# Patient Record
Sex: Male | Born: 1980 | Race: Black or African American | Hispanic: No | Marital: Single | Smoking: Current every day smoker
Health system: Southern US, Community
[De-identification: ages and names within clinical notes are randomized; demographics above are authoritative.]

## PROBLEM LIST (undated history)

## (undated) HISTORY — PX: FINGER SURGERY: SHX640

---

## 2009-09-11 ENCOUNTER — Emergency Department (HOSPITAL_COMMUNITY): Admission: EM | Admit: 2009-09-11 | Discharge: 2009-09-11 | Payer: Self-pay | Admitting: Emergency Medicine

## 2009-09-27 ENCOUNTER — Emergency Department (HOSPITAL_COMMUNITY): Admission: EM | Admit: 2009-09-27 | Discharge: 2009-09-27 | Payer: Self-pay | Admitting: Emergency Medicine

## 2009-10-02 ENCOUNTER — Emergency Department (HOSPITAL_COMMUNITY): Admission: EM | Admit: 2009-10-02 | Discharge: 2009-10-02 | Payer: Self-pay | Admitting: Emergency Medicine

## 2009-10-09 ENCOUNTER — Emergency Department (HOSPITAL_COMMUNITY): Admission: EM | Admit: 2009-10-09 | Discharge: 2009-10-09 | Payer: Self-pay | Admitting: Emergency Medicine

## 2010-04-21 LAB — POCT I-STAT, CHEM 8
BUN: 10 mg/dL (ref 6–23)
Calcium, Ion: 1.14 mmol/L (ref 1.12–1.32)
Chloride: 105 mEq/L (ref 96–112)
Glucose, Bld: 106 mg/dL — ABNORMAL HIGH (ref 70–99)

## 2013-01-24 ENCOUNTER — Encounter (HOSPITAL_COMMUNITY): Payer: Self-pay | Admitting: Emergency Medicine

## 2013-01-24 ENCOUNTER — Emergency Department (HOSPITAL_COMMUNITY)
Admission: EM | Admit: 2013-01-24 | Discharge: 2013-01-24 | Disposition: A | Discharge status: Home / Self Care | Payer: Self-pay | Attending: Emergency Medicine | Admitting: Emergency Medicine

## 2013-01-24 ENCOUNTER — Emergency Department (HOSPITAL_COMMUNITY): Payer: Self-pay

## 2013-01-24 DIAGNOSIS — IMO0002 Reserved for concepts with insufficient information to code with codable children: Secondary | ICD-10-CM

## 2013-01-24 DIAGNOSIS — Z87891 Personal history of nicotine dependence: Secondary | ICD-10-CM | POA: Insufficient documentation

## 2013-01-24 DIAGNOSIS — N509 Disorder of male genital organs, unspecified: Secondary | ICD-10-CM | POA: Insufficient documentation

## 2013-01-24 LAB — URINALYSIS, ROUTINE W REFLEX MICROSCOPIC
Bilirubin Urine: NEGATIVE
Glucose, UA: NEGATIVE mg/dL
Ketones, ur: NEGATIVE mg/dL
Leukocytes, UA: NEGATIVE
pH: 7.5 (ref 5.0–8.0)

## 2013-01-24 MED ORDER — IBUPROFEN 400 MG PO TABS
600.0000 mg | ORAL_TABLET | Freq: Once | ORAL | Status: DC
  Filled 2013-01-24 (×2): qty 1

## 2013-01-24 NOTE — ED Provider Notes (Signed)
CSN: [id number]     Arrival date & time 01/24/13  1032 History   First MD Initiated Contact with Patient 01/24/13 1056     Chief Complaint  Patient presents with  . Groin Pain   (Consider location/radiation/quality/duration/timing/severity/associated sxs/prior Treatment) Patient is a 32 y.o. male presenting with groin pain. The history is provided by the patient.  Groin Pain Pertinent negatives include no abdominal pain.  pt notes intermittent right testicle pain for past 2 years, but more pronounced/constant in past day. Pain w dull, mild-mod, non radiating. No specific exacerbating or alleviating factors. No trauma to area. No dysuria or hematuria. No flank pain. No hx kidney stones. No abd swelling or distension. No nv. Urinating or normal frequency. No skin lesions/ulcers. No penile discharge.      History reviewed. No pertinent past medical history. History reviewed. No pertinent past surgical history. History reviewed. No pertinent family history. History  Substance Use Topics  . Smoking status: Former Games developer  . Smokeless tobacco: Not on file  . Alcohol Use: No    Review of Systems  Constitutional: Negative for fever and chills.  Gastrointestinal: Negative for vomiting, abdominal pain, diarrhea and abdominal distention.  Genitourinary: Positive for testicular pain. Negative for dysuria, flank pain, discharge, scrotal swelling and genital sores.  Musculoskeletal: Negative for back pain.  Skin: Negative for rash and wound.  Hematological: Negative for adenopathy.    Allergies  Review of patient's allergies indicates no known allergies.  Home Medications  No current outpatient prescriptions on file. BP 155/94  Pulse 71  Temp(Src) 98.2 F (36.8 C) (Oral)  Resp 15  SpO2 98% Physical Exam  Nursing note and vitals reviewed. Constitutional: He is oriented to person, place, and time. He appears well-developed and well-nourished. No distress.  HENT:  Head: Atraumatic.   Eyes: Conjunctivae are normal.  Neck: Neck supple. No tracheal deviation present.  Cardiovascular: Normal rate.   Pulmonary/Chest: Effort normal. No accessory muscle usage. No respiratory distress.  Abdominal: Soft. Bowel sounds are normal. He exhibits no distension and no mass. There is no tenderness. There is no rebound and no guarding.  No hernia  Genitourinary:  Normal external genitalia. ?mild tenderness right testicle. No swelling. No skin changes or lesions. No l/a.  No hernia. No penile discharge.     Musculoskeletal: Normal range of motion.  Neurological: He is alert and oriented to person, place, and time.  Skin: Skin is warm and dry. He is not diaphoretic.  Psychiatric: He has a normal mood and affect.    ED Course  Procedures (including critical care time)  EKG Interpretation   None      Results for orders placed during the hospital encounter of 01/24/13  URINALYSIS, ROUTINE W REFLEX MICROSCOPIC      Result Value Range   Color, Urine YELLOW  YELLOW   APPearance CLEAR  CLEAR   Specific Gravity, Urine 1.016  1.005 - 1.030   pH 7.5  5.0 - 8.0   Glucose, UA NEGATIVE  NEGATIVE mg/dL   Hgb urine dipstick NEGATIVE  NEGATIVE   Bilirubin Urine NEGATIVE  NEGATIVE   Ketones, ur NEGATIVE  NEGATIVE mg/dL   Protein, ur NEGATIVE  NEGATIVE mg/dL   Urobilinogen, UA 1.0  0.0 - 1.0 mg/dL   Nitrite NEGATIVE  NEGATIVE   Leukocytes, UA NEGATIVE  NEGATIVE   US Scrotum  01/24/2013   CLINICAL DATA:  Right testicular pain.  Rule out torsion.  EXAM: ULTRASOUND OF SCROTUM  TECHNIQUE: Complete ultrasound examination of  the testicles, epididymis, and other scrotal structures was performed.  COMPARISON:  None.  FINDINGS: Right testicle  Measurements: 3.2 x 1.4 x 2.2 cm. No mass visualized. Scattered microcalcifications.  Left testicle  Measurements: 3.4 x 1.8 x 2.2 cm. No mass visualized. Scattered microcalcifications.  Right epididymis:  Normal in size and appearance.  Left epididymis:   Normal in size and appearance.  Hydrocele:  Bilateral, small.  Varicocele:  None visualized.  Doppler: Color and Doppler interrogation of the testes demonstrate normal and symmetric low resistance arterial and venous waveforms.  IMPRESSION: No acute findings.  No evidence of torsion.  Microlithiasis. Current literature suggests that testicular microlithiasis is not a significant independent risk factor for development of testicular carcinoma, and that follow up imaging is not warranted in the absence of other risk factors. Monthly testicular self-examination and annual physical exams are considered appropriate surveillance. If patient has other risk factors for testicular carcinoma, then referral to Urology should be considered. (Reference: DeCastro, et al.: A 5-Year Follow up Study of Asymptomatic Men with Testicular Microlithiasis. J Urol 2008; 179:1420-1423.)   Electronically Signed   By: Charlett Nose M.D.   On: 01/24/2013 11:45   [place] Art/ven Flow Abd Pelv Doppler  01/24/2013   CLINICAL DATA:  Right testicular pain.  Rule out torsion.  EXAM: ULTRASOUND OF SCROTUM  TECHNIQUE: Complete ultrasound examination of the testicles, epididymis, and other scrotal structures was performed.  COMPARISON:  None.  FINDINGS: Right testicle  Measurements: 3.2 x 1.4 x 2.2 cm. No mass visualized. Scattered microcalcifications.  Left testicle  Measurements: 3.4 x 1.8 x 2.2 cm. No mass visualized. Scattered microcalcifications.  Right epididymis:  Normal in size and appearance.  Left epididymis:  Normal in size and appearance.  Hydrocele:  Bilateral, small.  Varicocele:  None visualized.  Doppler: Color and Doppler interrogation of the testes demonstrate normal and symmetric low resistance arterial and venous waveforms.  IMPRESSION: No acute findings.  No evidence of torsion.  Microlithiasis. Current literature suggests that testicular microlithiasis is not a significant independent risk factor for development of testicular  carcinoma, and that follow up imaging is not warranted in the absence of other risk factors. Monthly testicular self-examination and annual physical exams are considered appropriate surveillance. If patient has other risk factors for testicular carcinoma, then referral to Urology should be considered. (Reference: DeCastro, et al.: A 5-Year Follow up Study of Asymptomatic Men with Testicular Microlithiasis. J Urol 2008; 179:1420-1423.)   Electronically Signed   By: Charlett Nose M.D.   On: 01/24/2013 11:45     MDM  Motrin po. Ua.   U/s.   Reviewed nursing notes and prior charts for additional history.   Recheck pt comfortable. Discussed u/s.  Pt appears stable for d/c.       Suzi Roots, MD 01/24/13 [phone]

## 2013-01-24 NOTE — ED Notes (Signed)
Pt. Stated, I've had on and off pain in my testicles for about 2 years.  Its even tender to touch.

## 2014-07-22 ENCOUNTER — Emergency Department (HOSPITAL_COMMUNITY)
Admission: EM | Admit: 2014-07-22 | Discharge: 2014-07-22 | Disposition: A | Discharge status: Home / Self Care | Payer: Self-pay | Attending: Emergency Medicine | Admitting: Emergency Medicine

## 2014-07-22 ENCOUNTER — Encounter (HOSPITAL_COMMUNITY): Payer: Self-pay | Admitting: *Deleted

## 2014-07-22 DIAGNOSIS — R1012 Left upper quadrant pain: Secondary | ICD-10-CM | POA: Insufficient documentation

## 2014-07-22 DIAGNOSIS — R197 Diarrhea, unspecified: Secondary | ICD-10-CM | POA: Insufficient documentation

## 2014-07-22 DIAGNOSIS — Z720 Tobacco use: Secondary | ICD-10-CM | POA: Insufficient documentation

## 2014-07-22 LAB — CBC WITH DIFFERENTIAL/PLATELET
Basophils Absolute: 0 10*3/uL (ref 0.0–0.1)
Basophils Relative: 0 % (ref 0–1)
EOS ABS: 0.1 10*3/uL (ref 0.0–0.7)
Eosinophils Relative: 1 % (ref 0–5)
HEMATOCRIT: 46.4 % (ref 39.0–52.0)
HEMOGLOBIN: 16.5 g/dL (ref 13.0–17.0)
Lymphocytes Relative: 32 % (ref 12–46)
Lymphs Abs: 1.6 10*3/uL (ref 0.7–4.0)
MCH: 30.1 pg (ref 26.0–34.0)
MCHC: 35.6 g/dL (ref 30.0–36.0)
MCV: 84.5 fL (ref 78.0–100.0)
Monocytes Absolute: 0.7 10*3/uL (ref 0.1–1.0)
Monocytes Relative: 13 % — ABNORMAL HIGH (ref 3–12)
NEUTROS ABS: 2.7 10*3/uL (ref 1.7–7.7)
Neutrophils Relative %: 54 % (ref 43–77)
Platelets: 237 10*3/uL (ref 150–400)
RBC: 5.49 MIL/uL (ref 4.22–5.81)
RDW: 12.9 % (ref 11.5–15.5)
WBC: 5.1 10*3/uL (ref 4.0–10.5)

## 2014-07-22 LAB — COMPREHENSIVE METABOLIC PANEL
ALBUMIN: 4.1 g/dL (ref 3.5–5.0)
ALT: 24 U/L (ref 17–63)
AST: 29 U/L (ref 15–41)
Alkaline Phosphatase: 80 U/L (ref 38–126)
Anion gap: 13 (ref 5–15)
BUN: 7 mg/dL (ref 6–20)
CALCIUM: 9.4 mg/dL (ref 8.9–10.3)
CO2: 23 mmol/L (ref 22–32)
CREATININE: 1.03 mg/dL (ref 0.61–1.24)
Chloride: 100 mmol/L — ABNORMAL LOW (ref 101–111)
GFR calc Af Amer: >60 mL/min (ref >60)
Glucose, Bld: 144 mg/dL — ABNORMAL HIGH (ref 65–99)
Potassium: 3.6 mmol/L (ref 3.5–5.1)
Sodium: 136 mmol/L (ref 135–145)
TOTAL PROTEIN: 7.6 g/dL (ref 6.5–8.1)
Total Bilirubin: 0.8 mg/dL (ref 0.3–1.2)

## 2014-07-22 LAB — LIPASE, BLOOD: Lipase: 31 U/L (ref 22–51)

## 2014-07-22 MED ORDER — LOPERAMIDE HCL 2 MG PO CAPS
2.0000 mg | ORAL_CAPSULE | Freq: Once | ORAL | Status: AC
  Administered 2014-07-22: 2 mg via ORAL
  Filled 2014-07-22: qty 1

## 2014-07-22 MED ORDER — LOPERAMIDE HCL 2 MG PO CAPS: 2.0000 mg | ORAL_CAPSULE | Freq: Four times a day (QID) | ORAL | Status: DC | PRN

## 2014-07-22 NOTE — ED Notes (Signed)
Pt states of diarrhea and abdominal pain after eating x 3 days.

## 2014-07-22 NOTE — ED Provider Notes (Signed)
CSN: [id number]     Arrival date & time 07/22/14  0719 History   First MD Initiated Contact with Patient 07/22/14 0747     Chief Complaint  Patient presents with  . Abdominal Pain     (Consider location/radiation/quality/duration/timing/severity/associated sxs/prior Treatment) Patient is a 34 y.o. male presenting with abdominal pain. The history is provided by the patient.  Abdominal Pain Associated symptoms: diarrhea   Associated symptoms: no chest pain, no fever, no nausea, no shortness of breath and no vomiting   patient presents with dull abdominal pain and diarrhea for 3 days. No sick contacts with diarrhea but his daughter did have a sore throat. No fevers. No nausea. The pain is dull and crampy. States the diarrhea and the pain increased after eating. He has not had recent antibodies. He has not had atelectasis in the past. Diarrhea is just watery. No blood. No nausea vomiting.  History reviewed. No pertinent past medical history. History reviewed. No pertinent past surgical history. No family history on file. History  Substance Use Topics  . Smoking status: Current Every Day Smoker -- 0.50 packs/day    Types: Cigarettes  . Smokeless tobacco: Not on file  . Alcohol Use: Yes     Comment: occ    Review of Systems  Constitutional: Negative for fever, activity change and appetite change.  Eyes: Negative for pain.  Respiratory: Negative for chest tightness and shortness of breath.   Cardiovascular: Negative for chest pain and leg swelling.  Gastrointestinal: Positive for abdominal pain and diarrhea. Negative for nausea and vomiting.  Genitourinary: Negative for flank pain.  Musculoskeletal: Negative for back pain and neck stiffness.  Skin: Negative for rash.  Neurological: Negative for weakness, numbness and headaches.  Psychiatric/Behavioral: Negative for behavioral problems.      Allergies  Review of patient's allergies indicates no known allergies.  Home Medications    Prior to Admission medications   Medication Sig Start Date End Date Taking? Authorizing Provider  loperamide (IMODIUM) 2 MG capsule Take 1 capsule (2 mg total) by mouth 4 (four) times daily as needed for diarrhea or loose stools. 07/22/14   Benjiman Core, MD   BP 154/98 mmHg  Pulse 61  Temp(Src) 98.2 F (36.8 C) (Oral)  Resp 18  Ht  (1.727 m)  Wt 239 lb (108.41 kg)  BMI 36.35 kg/m2  SpO2 97% Physical Exam  Constitutional: He is oriented to person, place, and time. He appears well-developed and well-nourished.  HENT:  Head: Normocephalic and atraumatic.  Neck: Normal range of motion. Neck supple.  Cardiovascular: Normal rate, regular rhythm and normal heart sounds.   No murmur heard. Pulmonary/Chest: Effort normal and breath sounds normal.  Abdominal: Soft. He exhibits no distension and no mass. There is tenderness. There is no rebound and no guarding.  Mild upper abdominal and left sided abdominal tenderness without rebound or guarding. No right lower quadrant tenderness.  Musculoskeletal: Normal range of motion. He exhibits no edema.  Neurological: He is alert and oriented to person, place, and time. No cranial nerve deficit.  Skin: Skin is warm and dry.  Psychiatric: He has a normal mood and affect.  Nursing note and vitals reviewed.   ED Course  Procedures (including critical care time) Labs Review Labs Reviewed  CBC WITH DIFFERENTIAL/PLATELET - Abnormal; Notable for the following:    Monocytes Relative 13 (*)    All other components within normal limits  COMPREHENSIVE METABOLIC PANEL - Abnormal; Notable for the following:    Chloride  100 (*)    Glucose, Bld 144 (*)    All other components within normal limits  LIPASE, BLOOD    Imaging Review No results found.   EKG Interpretation None      MDM   Final diagnoses:  Diarrhea    Patient with diarrhea and abdominal cramping. Benign exam. No vomiting. Slightly elevated sugar and it has some  hypertension. White count is not significantly elevated. Will discharge home. Patient was informed of the need to follow-up for his blood glucose and hypertension. Doubt severe infection or diverticulitis but was given follow-up instructions.    Benjiman Core, MD 07/22/14 [phone]

## 2014-07-22 NOTE — Discharge Instructions (Signed)

## 2014-07-27 ENCOUNTER — Encounter (HOSPITAL_COMMUNITY): Payer: Self-pay | Admitting: *Deleted

## 2014-07-27 ENCOUNTER — Emergency Department (HOSPITAL_COMMUNITY): Payer: Self-pay

## 2014-07-27 ENCOUNTER — Emergency Department (HOSPITAL_COMMUNITY)
Admission: EM | Admit: 2014-07-27 | Discharge: 2014-07-27 | Disposition: A | Discharge status: Home / Self Care | Payer: Self-pay | Attending: Emergency Medicine | Admitting: Emergency Medicine

## 2014-07-27 DIAGNOSIS — Z113 Encounter for screening for infections with a predominantly sexual mode of transmission: Secondary | ICD-10-CM | POA: Insufficient documentation

## 2014-07-27 DIAGNOSIS — Z720 Tobacco use: Secondary | ICD-10-CM | POA: Insufficient documentation

## 2014-07-27 DIAGNOSIS — Z711 Person with feared health complaint in whom no diagnosis is made: Secondary | ICD-10-CM

## 2014-07-27 DIAGNOSIS — R109 Unspecified abdominal pain: Secondary | ICD-10-CM

## 2014-07-27 DIAGNOSIS — N5089 Other specified disorders of the male genital organs: Secondary | ICD-10-CM

## 2014-07-27 DIAGNOSIS — N50812 Left testicular pain: Secondary | ICD-10-CM

## 2014-07-27 DIAGNOSIS — R197 Diarrhea, unspecified: Secondary | ICD-10-CM | POA: Insufficient documentation

## 2014-07-27 DIAGNOSIS — N508 Other specified disorders of male genital organs: Secondary | ICD-10-CM | POA: Insufficient documentation

## 2014-07-27 LAB — CBC WITH DIFFERENTIAL/PLATELET
Basophils Absolute: 0 10*3/uL (ref 0.0–0.1)
Basophils Relative: 0 % (ref 0–1)
Eosinophils Absolute: 0.1 10*3/uL (ref 0.0–0.7)
Eosinophils Relative: 2 % (ref 0–5)
HCT: 43.8 % (ref 39.0–52.0)
Hemoglobin: 15.6 g/dL (ref 13.0–17.0)
Lymphocytes Relative: 41 % (ref 12–46)
Lymphs Abs: 2.5 10*3/uL (ref 0.7–4.0)
MCH: 30.2 pg (ref 26.0–34.0)
MCHC: 35.6 g/dL (ref 30.0–36.0)
MCV: 84.7 fL (ref 78.0–100.0)
Monocytes Absolute: 0.5 10*3/uL (ref 0.1–1.0)
Monocytes Relative: 8 % (ref 3–12)
Neutro Abs: 3 10*3/uL (ref 1.7–7.7)
Neutrophils Relative %: 49 % (ref 43–77)
Platelets: 246 10*3/uL (ref 150–400)
RBC: 5.17 MIL/uL (ref 4.22–5.81)
RDW: 12.9 % (ref 11.5–15.5)
WBC: 6.2 10*3/uL (ref 4.0–10.5)

## 2014-07-27 LAB — COMPREHENSIVE METABOLIC PANEL
ALT: 19 U/L (ref 17–63)
AST: 21 U/L (ref 15–41)
Albumin: 4 g/dL (ref 3.5–5.0)
Alkaline Phosphatase: 73 U/L (ref 38–126)
Anion gap: 6 (ref 5–15)
BUN: 8 mg/dL (ref 6–20)
CO2: 23 mmol/L (ref 22–32)
Calcium: 9.1 mg/dL (ref 8.9–10.3)
Chloride: 107 mmol/L (ref 101–111)
Creatinine, Ser: 0.96 mg/dL (ref 0.61–1.24)
GFR calc Af Amer: >60 mL/min (ref >60)
GFR calc non Af Amer: >60 mL/min (ref >60)
Glucose, Bld: 118 mg/dL — ABNORMAL HIGH (ref 65–99)
Potassium: 3.9 mmol/L (ref 3.5–5.1)
Sodium: 136 mmol/L (ref 135–145)
Total Bilirubin: 0.6 mg/dL (ref 0.3–1.2)
Total Protein: 6.6 g/dL (ref 6.5–8.1)

## 2014-07-27 LAB — URINALYSIS, ROUTINE W REFLEX MICROSCOPIC
Bilirubin Urine: NEGATIVE
Glucose, UA: NEGATIVE mg/dL
Hgb urine dipstick: NEGATIVE
Ketones, ur: NEGATIVE mg/dL
Leukocytes, UA: NEGATIVE
Nitrite: NEGATIVE
Protein, ur: NEGATIVE mg/dL
Specific Gravity, Urine: 1.011 (ref 1.005–1.030)
Urobilinogen, UA: 0.2 mg/dL (ref 0.0–1.0)
pH: 7.5 (ref 5.0–8.0)

## 2014-07-27 LAB — LIPASE, BLOOD: Lipase: 35 U/L (ref 22–51)

## 2014-07-27 MED ORDER — CEFTRIAXONE SODIUM 250 MG IJ SOLR
250.0000 mg | Freq: Once | INTRAMUSCULAR | Status: AC
  Administered 2014-07-27: 250 mg via INTRAMUSCULAR
  Filled 2014-07-27: qty 250

## 2014-07-27 MED ORDER — CEFTRIAXONE SODIUM 250 MG IJ SOLR: 250.0000 mg | Freq: Once | INTRAMUSCULAR | Status: DC

## 2014-07-27 MED ORDER — LIDOCAINE HCL (PF) 1 % IJ SOLN
0.9000 mL | Freq: Once | INTRAMUSCULAR | Status: AC
  Administered 2014-07-27: 0.9 mL

## 2014-07-27 MED ORDER — DEXTROSE 5 % IV SOLN: 250.0000 mg | Freq: Once | INTRAVENOUS | Status: DC

## 2014-07-27 MED ORDER — AZITHROMYCIN 250 MG PO TABS
1000.0000 mg | ORAL_TABLET | Freq: Once | ORAL | Status: AC
  Administered 2014-07-27: 1000 mg via ORAL
  Filled 2014-07-27: qty 4

## 2014-07-27 MED ORDER — LIDOCAINE HCL (PF) 1 % IJ SOLN
INTRAMUSCULAR | Status: AC
  Filled 2014-07-27: qty 5

## 2014-07-27 NOTE — ED Notes (Signed)
Pt returned from US

## 2014-07-27 NOTE — ED Notes (Addendum)
Reports lower abd pain and testicle pain intermittent and diarrhea. No distress noted. Denies n/v.

## 2014-07-27 NOTE — ED Provider Notes (Signed)
CSN: [id number]     Arrival date & time 07/27/14  0736 History   First MD Initiated Contact with Patient 07/27/14 [phone]     Chief Complaint  Patient presents with  . Abdominal Pain  . Diarrhea     (Consider location/radiation/quality/duration/timing/severity/associated sxs/prior Treatment) HPI  Pt is a 33yo male presenting to ED with c/o generalized abdominal pain with associated intermittent loose stools for 1.5 weeks. Pt was seen for same on 07/22/14. States diarrhea has improved with imodium but stools are still loose. No blood or mucous in stool. Abdominal pain is aching and cramping, 10/10, no pain medications taken PTA.  Pt states pain radiates into his Left testicle intermittently. Pt concerned for possible STD as he has had unprotected intercourse w/o specific known exposure.   History reviewed. No pertinent past medical history. History reviewed. No pertinent past surgical history. History reviewed. No pertinent family history. History  Substance Use Topics  . Smoking status: Current Every Day Smoker -- 0.50 packs/day    Types: Cigarettes  . Smokeless tobacco: Not on file  . Alcohol Use: Yes     Comment: occ    Review of Systems  Constitutional: Negative for fever, chills, diaphoresis and fatigue.  Respiratory: Negative for cough and shortness of breath.   Gastrointestinal: Positive for abdominal pain (diffuse) and diarrhea. Negative for nausea and vomiting.  Genitourinary: Positive for testicular pain (Right side). Negative for dysuria, urgency, frequency, hematuria, flank pain, decreased urine volume, discharge, penile swelling, scrotal swelling and penile pain.  Musculoskeletal: Negative for myalgias and back pain.  All other systems reviewed and are negative.     Allergies  Review of patient's allergies indicates no known allergies.  Home Medications   Prior to Admission medications   Medication Sig Start Date End Date Taking? Authorizing Provider  loperamide  (IMODIUM) 2 MG capsule Take 1 capsule (2 mg total) by mouth 4 (four) times daily as needed for diarrhea or loose stools. 07/22/14  Yes Benjiman Core, MD   BP 143/88 mmHg  Pulse 79  Temp(Src) 97.7 F (36.5 C) (Oral)  Resp 12  SpO2 100% Physical Exam  Constitutional: He appears well-developed and well-nourished.  HENT:  Head: Normocephalic and atraumatic.  Eyes: Conjunctivae are normal. No scleral icterus.  Neck: Normal range of motion.  Cardiovascular: Normal rate, regular rhythm and normal heart sounds.   Pulmonary/Chest: Effort normal and breath sounds normal. No respiratory distress. He has no wheezes. He has no rales. He exhibits no tenderness.  Abdominal: Soft. Bowel sounds are normal. He exhibits no distension and no mass. There is no tenderness. There is no rebound and no guarding.  Soft, non-distended, non-tender. No CVAT  Genitourinary: Penis normal. Right testis shows no mass, no swelling and no tenderness. Left testis shows tenderness. Left testis shows no mass and no swelling. Circumcised. No phimosis, paraphimosis, hypospadias, penile erythema or penile tenderness. No discharge found.  Chaperoned exam.   Musculoskeletal: Normal range of motion.  Neurological: He is alert.  Skin: Skin is warm and dry.  Nursing note and vitals reviewed.   ED Course  Procedures (including critical care time) Labs Review Labs Reviewed  COMPREHENSIVE METABOLIC PANEL - Abnormal; Notable for the following:    Glucose, Bld 118 (*)    All other components within normal limits  STOOL CULTURE  CBC WITH DIFFERENTIAL/PLATELET  LIPASE, BLOOD  URINALYSIS, ROUTINE W REFLEX MICROSCOPIC (NOT AT [facility])  GC/CHLAMYDIA PROBE AMP ([place]) NOT AT [facility]    Imaging Review US Scrotum  07/27/2014   CLINICAL DATA:  34 year old male with 1 week of left testicular pain. No known trauma. Initial encounter.  EXAM: SCROTAL ULTRASOUND  DOPPLER ULTRASOUND OF THE TESTICLES  TECHNIQUE: Complete ultrasound  examination of the testicles, epididymis, and other scrotal structures was performed. Color and spectral Doppler ultrasound were also utilized to evaluate blood flow to the testicles.  COMPARISON:  01/24/2013  FINDINGS: Right testicle  Measurements: 3.6 x 1.6 x 2.5 cm. No testicular mass, but there are chronic dystrophic calcifications/ microlithiasis similar to the prior study.  Left testicle  Measurements: 3.5 x 1.4 x 2.7 cm. No testicular mass, but chronic dystrophic calcification/microlithiasis similar to the 2014 study  Right epididymis:  Normal in size and appearance.  Left epididymis:  Normal in size and appearance.  Hydrocele:  None visualized.  Varicocele:  None visualized.  Pulsed Doppler interrogation of both testes demonstrates normal low resistance arterial and venous waveforms bilaterally.  IMPRESSION: 1. No evidence of testicular torsion or acute inflammation. 2. Chronic microlithiasis. Current literature suggests that testicular microlithiasis is not a significant independent risk factor for development of testicular carcinoma, and that follow up imaging is not warranted in the absence of other risk factors. Monthly testicular self-examination and annual physical exams are considered appropriate surveillance. If patient has other risk factors for testicular carcinoma, then referral to Urology should be considered. (Reference: DeCastro, et al.: A 5-Year Follow up Study of Asymptomatic Men with Testicular Microlithiasis. J Urol 2008; 179:1420-1423.)   Electronically Signed   By: Odessa Fleming M.D.   On: 07/27/2014 09:06   [place] Art/ven Flow Abd Pelv Doppler  07/27/2014   CLINICAL DATA:  34 year old male with 1 week of left testicular pain. No known trauma. Initial encounter.  EXAM: SCROTAL ULTRASOUND  DOPPLER ULTRASOUND OF THE TESTICLES  TECHNIQUE: Complete ultrasound examination of the testicles, epididymis, and other scrotal structures was performed. Color and spectral Doppler ultrasound were also utilized  to evaluate blood flow to the testicles.  COMPARISON:  01/24/2013  FINDINGS: Right testicle  Measurements: 3.6 x 1.6 x 2.5 cm. No testicular mass, but there are chronic dystrophic calcifications/ microlithiasis similar to the prior study.  Left testicle  Measurements: 3.5 x 1.4 x 2.7 cm. No testicular mass, but chronic dystrophic calcification/microlithiasis similar to the 2014 study  Right epididymis:  Normal in size and appearance.  Left epididymis:  Normal in size and appearance.  Hydrocele:  None visualized.  Varicocele:  None visualized.  Pulsed Doppler interrogation of both testes demonstrates normal low resistance arterial and venous waveforms bilaterally.  IMPRESSION: 1. No evidence of testicular torsion or acute inflammation. 2. Chronic microlithiasis. Current literature suggests that testicular microlithiasis is not a significant independent risk factor for development of testicular carcinoma, and that follow up imaging is not warranted in the absence of other risk factors. Monthly testicular self-examination and annual physical exams are considered appropriate surveillance. If patient has other risk factors for testicular carcinoma, then referral to Urology should be considered. (Reference: DeCastro, et al.: A 5-Year Follow up Study of Asymptomatic Men with Testicular Microlithiasis. J Urol 2008; 179:1420-1423.)   Electronically Signed   By: Odessa Fleming M.D.   On: 07/27/2014 09:06     EKG Interpretation None      MDM   Final diagnoses:  Testicular pain, left  Abdominal cramping  Diarrhea  Testicular microlithiasis  Concern about STD in male without diagnosis   Pt is a 34yo male presenting to ED with c/o generalized abdominal pain, loose stools and  pain radiating into his Left testicle intermittently. Concern for STD.  Benign abdominal exam. Not concerned for surgical abdomen. Scrotal ultrasound performed due to Left testicular tenderness. No masses or swelling noted on exam. U/S: No evidence  of testicular torsion or acute inflammation. Chronic microlithiasis.  Not considered a significant independent risk factor for testicular carcinoma, however, due to pt having pain as well as pt's young age and desire to possibly have children in the future, will refer to urology for further evaluation of microlithiasis.   GC/chlamydia swab sent to lab. Pt treated in ED empirically due to pt's concerns and hx of unprotected intercourse.   Pt had been c/o diarrhea, however, reports 1 loose stool yesterday and no BMs today.  Pt seems to be improving from initial visit to ED for diarrhea on 6/16. No evidence of emergent process taking place at this time. Discussed pt with Dr. Micheline Maze who also reviewed pt's medical records and imaging. Agrees with plan to have pt discharged home. F/u with PCP and urology, Dr. Marlou Porch. Home care instructions for diarrhea and testicular self-exam provided. Return precautions provided. Pt verbalized understanding and agreement with tx plan.      Junius Finner, PA-C 07/27/14 1016  Toy Cookey, MD 07/28/14 1120

## 2014-07-27 NOTE — ED Notes (Signed)
Pt at ultrasound

## 2014-07-28 LAB — GC/CHLAMYDIA PROBE AMP (~~LOC~~) NOT AT ARMC
Chlamydia: NEGATIVE
Neisseria Gonorrhea: NEGATIVE

## 2014-12-26 ENCOUNTER — Encounter (HOSPITAL_COMMUNITY): Payer: Self-pay | Admitting: Emergency Medicine

## 2014-12-26 ENCOUNTER — Emergency Department (HOSPITAL_COMMUNITY)
Admission: EM | Admit: 2014-12-26 | Discharge: 2014-12-26 | Disposition: A | Discharge status: Home / Self Care | Payer: Self-pay | Attending: Emergency Medicine | Admitting: Emergency Medicine

## 2014-12-26 DIAGNOSIS — S61215A Laceration without foreign body of left ring finger without damage to nail, initial encounter: Secondary | ICD-10-CM | POA: Insufficient documentation

## 2014-12-26 DIAGNOSIS — Y998 Other external cause status: Secondary | ICD-10-CM | POA: Insufficient documentation

## 2014-12-26 DIAGNOSIS — IMO0002 Reserved for concepts with insufficient information to code with codable children: Secondary | ICD-10-CM

## 2014-12-26 DIAGNOSIS — W268XXA Contact with other sharp object(s), not elsewhere classified, initial encounter: Secondary | ICD-10-CM | POA: Insufficient documentation

## 2014-12-26 DIAGNOSIS — Y9389 Activity, other specified: Secondary | ICD-10-CM | POA: Insufficient documentation

## 2014-12-26 DIAGNOSIS — Y9289 Other specified places as the place of occurrence of the external cause: Secondary | ICD-10-CM | POA: Insufficient documentation

## 2014-12-26 DIAGNOSIS — F17210 Nicotine dependence, cigarettes, uncomplicated: Secondary | ICD-10-CM | POA: Insufficient documentation

## 2014-12-26 MED ORDER — BACITRACIN ZINC 500 UNIT/GM EX OINT
1.0000 "application " | TOPICAL_OINTMENT | Freq: Two times a day (BID) | CUTANEOUS | Status: DC
  Administered 2014-12-26: 1 via TOPICAL
  Filled 2014-12-26: qty 0.9

## 2014-12-26 NOTE — Discharge Instructions (Signed)

## 2014-12-26 NOTE — ED Notes (Addendum)
Laceration to L 3rd digit from razor blade 20 min ago.  Pt had washcloth wrapped around finger.  Once he removed it in triage room and looked at finger he wanted to get a band-aid and leave.  Encouraged pt to wait and be seen.  Unknown last DT. Bleeding controlled.

## 2014-12-26 NOTE — ED Provider Notes (Signed)
CSN: [id number]     Arrival date & time 12/26/14  1945 History  By signing my name below, I, Murriel Hopperlec Bankhead, attest that this documentation has been prepared under the direction and in the presence of Rob  Estral BeachBrowning, [place] JerseyPA-C.  Electronically Signed: Murriel HopperAlec Bankhead, ED Scribe. 12/26/2014. 8:02 PM.   Chief Complaint  Patient presents with  . Extremity Laceration      The history is provided by the patient. No language interpreter was used.   HPI Comments: Carl Russell is a 34 y.o. male who presents to the Emergency Department complaining of a small laceration to his left third digit that has been present since pt cut his finger on a new razor blade twenty minutes PTA. Pt states that his finger would not stop bleeding earlier, even after applying pressure with a washcloth to the area. Pt states he came into ED for that reason, but reported that once he got back in a room, the laceration stopped bleeding. Pt asked if he could leave because the area stopped bleeding. Pt refused tetanus shot.    History reviewed. No pertinent past medical history. History reviewed. No pertinent past surgical history. No family history on file. Social History  Substance Use Topics  . Smoking status: Current Every Day Smoker -- 0.50 packs/day    Types: Cigarettes  . Smokeless tobacco: None  . Alcohol Use: Yes     Comment: occ    Review of Systems  Skin: Positive for wound.  All other systems reviewed and are negative.     Allergies  Review of patient's allergies indicates no known allergies.  Home Medications   Prior to Admission medications   Medication Sig Start Date End Date Taking? Authorizing Provider  loperamide (IMODIUM) 2 MG capsule Take 1 capsule (2 mg total) by mouth 4 (four) times daily as needed for diarrhea or loose stools. 07/22/14   Benjiman CoreNathan Pickering, MD   BP 154/90 mmHg  Pulse 88  Temp(Src) 98.2 F (36.8 C) (Oral)  Resp 18  SpO2 97% Physical Exam  Constitutional: He is oriented to  person, place, and time. He appears well-developed and well-nourished.  HENT:  Head: Normocephalic and atraumatic.  Cardiovascular: Normal rate.   Pulmonary/Chest: Effort normal.  Abdominal: He exhibits no distension.  Neurological: He is alert and oriented to person, place, and time.  Skin: Skin is warm and dry.  Very shallow cut to distal left middle finger (no repair required)  Psychiatric: He has a normal mood and affect.  Nursing note and vitals reviewed.   ED Course  Procedures (including critical care time)  DIAGNOSTIC STUDIES: Oxygen Saturation is 97% on room air, normal by my interpretation.    COORDINATION OF CARE: 8:01 PM Discussed treatment plan with pt at bedside and pt agreed to plan.     MDM   Final diagnoses:  Laceration    Patient with small cut to finger.  No repair required.  Patient refuses tdap.  Requests to be discharge after uncovering finger and seeing simple nature of injury.  I personally performed the services described in this documentation, which was scribed in my presence. The recorded information has been reviewed and is accurate.     Roxy HorsemanRobert Trease [place], PA-C 12/26/14 2041  Richardean Canalavid H Yao, MD 12/26/14 [phone]

## 2015-11-06 ENCOUNTER — Encounter (HOSPITAL_COMMUNITY): Payer: Self-pay | Admitting: Emergency Medicine

## 2015-11-06 ENCOUNTER — Emergency Department (HOSPITAL_COMMUNITY)
Admission: EM | Admit: 2015-11-06 | Discharge: 2015-11-06 | Disposition: A | Discharge status: Home / Self Care | Payer: Self-pay | Attending: Emergency Medicine | Admitting: Emergency Medicine

## 2015-11-06 ENCOUNTER — Emergency Department (HOSPITAL_COMMUNITY): Payer: Self-pay

## 2015-11-06 DIAGNOSIS — R748 Abnormal levels of other serum enzymes: Secondary | ICD-10-CM | POA: Insufficient documentation

## 2015-11-06 DIAGNOSIS — F17210 Nicotine dependence, cigarettes, uncomplicated: Secondary | ICD-10-CM | POA: Insufficient documentation

## 2015-11-06 DIAGNOSIS — R1084 Generalized abdominal pain: Secondary | ICD-10-CM | POA: Insufficient documentation

## 2015-11-06 LAB — CBC
HEMATOCRIT: 46.7 % (ref 39.0–52.0)
HEMOGLOBIN: 15.6 g/dL (ref 13.0–17.0)
MCH: 29.7 pg (ref 26.0–34.0)
MCHC: 33.4 g/dL (ref 30.0–36.0)
MCV: 88.8 fL (ref 78.0–100.0)
Platelets: 233 10*3/uL (ref 150–400)
RBC: 5.26 MIL/uL (ref 4.22–5.81)
RDW: 12.8 % (ref 11.5–15.5)
WBC: 7.6 10*3/uL (ref 4.0–10.5)

## 2015-11-06 LAB — COMPREHENSIVE METABOLIC PANEL
ALBUMIN: 3.8 g/dL (ref 3.5–5.0)
ALT: 20 U/L (ref 17–63)
ANION GAP: 8 (ref 5–15)
AST: 23 U/L (ref 15–41)
Alkaline Phosphatase: 68 U/L (ref 38–126)
BILIRUBIN TOTAL: 0.8 mg/dL (ref 0.3–1.2)
BUN: 10 mg/dL (ref 6–20)
CO2: 23 mmol/L (ref 22–32)
Calcium: 9.2 mg/dL (ref 8.9–10.3)
Chloride: 106 mmol/L (ref 101–111)
Creatinine, Ser: 0.96 mg/dL (ref 0.61–1.24)
GFR calc Af Amer: >60 mL/min (ref >60)
Glucose, Bld: 100 mg/dL — ABNORMAL HIGH (ref 65–99)
POTASSIUM: 3.4 mmol/L — AB (ref 3.5–5.1)
Sodium: 137 mmol/L (ref 135–145)
TOTAL PROTEIN: 6.2 g/dL — AB (ref 6.5–8.1)

## 2015-11-06 LAB — URINALYSIS, ROUTINE W REFLEX MICROSCOPIC
Bilirubin Urine: NEGATIVE
Glucose, UA: NEGATIVE mg/dL
Ketones, ur: NEGATIVE mg/dL
LEUKOCYTES UA: NEGATIVE
NITRITE: NEGATIVE
PH: 6 (ref 5.0–8.0)
Protein, ur: NEGATIVE mg/dL

## 2015-11-06 LAB — URINE MICROSCOPIC-ADD ON
Bacteria, UA: NONE SEEN
RBC / HPF: NONE SEEN RBC/hpf (ref 0–5)
WBC UA: NONE SEEN WBC/hpf (ref 0–5)

## 2015-11-06 LAB — LIPASE, BLOOD: Lipase: 127 U/L — ABNORMAL HIGH (ref 11–51)

## 2015-11-06 MED ORDER — ONDANSETRON HCL 4 MG PO TABS: 4.0000 mg | ORAL_TABLET | Freq: Three times a day (TID) | ORAL | 0 refills | Status: DC | PRN

## 2015-11-06 MED ORDER — ONDANSETRON 4 MG PO TBDP
4.0000 mg | ORAL_TABLET | Freq: Once | ORAL | Status: AC
  Administered 2015-11-06: 4 mg via ORAL
  Filled 2015-11-06: qty 1

## 2015-11-06 MED ORDER — DICYCLOMINE HCL 10 MG PO CAPS
20.0000 mg | ORAL_CAPSULE | Freq: Once | ORAL | Status: AC
Start: 2015-11-06 — End: 2015-11-06
  Administered 2015-11-06: 20 mg via ORAL
  Filled 2015-11-06: qty 2

## 2015-11-06 MED ORDER — ACETAMINOPHEN 500 MG PO TABS
1000.0000 mg | ORAL_TABLET | Freq: Once | ORAL | Status: AC
  Administered 2015-11-06: 1000 mg via ORAL
  Filled 2015-11-06: qty 2

## 2015-11-06 MED ORDER — DICYCLOMINE HCL 20 MG PO TABS: 20.0000 mg | ORAL_TABLET | Freq: Four times a day (QID) | ORAL | 0 refills | Status: DC | PRN

## 2015-11-06 NOTE — Discharge Instructions (Signed)
Please avoid alcohol use and fatty foods. You should remain on a clear liquid diet for the next 24-72 hours to allow your pancrease to rest. This likely the cause of your pain and your episode of vomiting today.  Follow the diet prescribed for pancreatitis after your symptoms have resolved.  SEEK IMMEDIATE MEDICAL ATTENTION IF: The pain does not go away or becomes severe.  A temperature above 101 develops.  Repeated vomiting occurs (multiple episodes).  The pain becomes localized to portions of the abdomen. The right side could possibly be appendicitis. In an adult, the left lower portion of the abdomen could be colitis or diverticulitis.  Blood is being passed in stools or vomit (bright red or black tarry stools).  Return also if you develop chest pain, difficulty breathing, dizziness or fainting, or become confused, poorly responsive, or inconsolable (young children).

## 2015-11-06 NOTE — ED Provider Notes (Signed)
[facility] Provider Note   CSN: [id number] Arrival date & time: 11/06/15  [id number]     History   Chief Complaint Chief Complaint  Patient presents with  . Abdominal Pain    HPI Carl Russell is a 35 y.o. male who has no contributing past medical history who presents emergency Department with chief complaint of crampy abdominal pain. Patient has had 3 days of abdominal pain. He describes the pain as crampy in the lower quadrants predominantly. He has had some nausea and one episode of vomiting, nonbloody, nonbilious vomitus last night. His pain coincides with 3 days of no cathartic bowel movements. He states that he tried to make Bowel movement last night, but only produced a few small hard stool balls. He has a history of constipation once about a year ago which self resolved. He has not taken any medications or tried any other interventions. He denies any previous history of surgeries to the abdomen. He denies fevers, chills, myalgias or other symptoms of systemic infection. HPI  History reviewed. No pertinent past medical history.  There are no active problems to display for this patient.   History reviewed. No pertinent surgical history.     Home Medications    Prior to Admission medications   Medication Sig Start Date End Date Taking? Authorizing Provider  dicyclomine (BENTYL) 20 MG tablet Take 1 tablet (20 mg total) by mouth 4 (four) times daily as needed for spasms. 11/06/15   Arthor CaptainAbigail Arsema Tusing, PA-C  loperamide (IMODIUM) 2 MG capsule Take 1 capsule (2 mg total) by mouth 4 (four) times daily as needed for diarrhea or loose stools. 07/22/14   Benjiman CoreNathan Pickering, MD  ondansetron (ZOFRAN) 4 MG tablet Take 1 tablet (4 mg total) by mouth every 8 (eight) hours as needed for nausea or vomiting. 11/06/15   Arthor CaptainAbigail Kaesha Kirsch, PA-C    Family History No family history on file.  Social History Social History  Substance Use Topics  . Smoking status: Current Every Day Smoker   Packs/day: 0.50    Types: Cigarettes  . Smokeless tobacco: Not on file  . Alcohol use Yes     Comment: occ     Allergies   Review of patient's allergies indicates no known allergies.   Review of Systems Review of Systems Ten systems reviewed and are negative for acute change, except as noted in the HPI.    Physical Exam Updated Vital Signs BP 121/81   Pulse (!) 57   Temp 97.7 F (36.5 C) (Oral)   Resp 16   Ht 5\' 8"  (1.727 m)   Wt 104.3 kg   SpO2 100%   BMI 34.97 kg/m   Physical Exam Physical Exam  Nursing note and vitals reviewed. Constitutional: He appears well-developed and well-nourished. No distress.  HENT:  Head: Normocephalic and atraumatic.  Eyes: Conjunctivae normal are normal. No scleral icterus.  Neck: Normal range of motion. Neck supple.  Cardiovascular: Normal rate, regular rhythm and normal heart sounds.   Pulmonary/Chest: Effort normal and breath sounds normal. No respiratory distress.  Abdominal: Soft. Distended, no focal tenderness, normal bowel sounds.  Musculoskeletal: He exhibits no edema.  Neurological: He is alert.  Skin: Skin is warm and dry. He is not diaphoretic.  Psychiatric: His behavior is normal.     ED Treatments / Results  Labs (all labs ordered are listed, but only abnormal results are displayed) Labs Reviewed  LIPASE, BLOOD - Abnormal; Notable for the following:       Result Value  Lipase 127 (*)    All other components within normal limits  COMPREHENSIVE METABOLIC PANEL - Abnormal; Notable for the following:    Potassium 3.4 (*)    Glucose, Bld 100 (*)    Total Protein 6.2 (*)    All other components within normal limits  URINALYSIS, ROUTINE W REFLEX MICROSCOPIC (NOT AT [facility]) - Abnormal; Notable for the following:    Specific Gravity, Urine >1.030 (*)    Hgb urine dipstick TRACE (*)    All other components within normal limits  URINE MICROSCOPIC-ADD ON - Abnormal; Notable for the following:    Squamous Epithelial /  LPF 0-5 (*)    All other components within normal limits  CBC    EKG  EKG Interpretation None       Radiology Dg Chest 2 View  Result Date: 11/06/2015 CLINICAL DATA:  35 year old male with history of lung opacity incompletely imaged on abdominal radiograph. EXAM: CHEST  2 VIEW COMPARISON:  No prior chest x-ray.  Abdominal radiograph 11/06/2015. FINDINGS: Finding on the recent chest radiograph corresponds to the normal portion of the right hemidiaphragm. However, there is also eventration of the right hemidiaphragm (normal anatomical variant). Lung volumes are normal. No consolidative airspace disease. No pleural effusions. No pneumothorax. No pulmonary nodule or mass noted. Pulmonary vasculature and the cardiomediastinal silhouette are within normal limits. IMPRESSION: 1. No radiographic evidence of acute cardiopulmonary disease. The finding on the recent abdominal radiograph is benign, as discussed above. Electronically Signed   By: Trudie Reed M.D.   On: 11/06/2015 08:22   Dg Abdomen 1 View  Result Date: 11/06/2015 CLINICAL DATA:  Pt complains of epigastric pain and constipation x 3 days; he reports emesis once yesterday; no history of abdominal surgery EXAM: ABDOMEN - 1 VIEW COMPARISON:  None. FINDINGS: There is no bowel dilation to suggest bowel obstruction or diffuse adynamic ileus. There is no free air. No evidence of renal or ureteral stones. Soft tissues are unremarkable. There is opacity at the right lung base obscuring the right hemidiaphragm. This is incompletely imaged. Skeletal structures are unremarkable. IMPRESSION: 1. No abnormality in the abdomen pelvis. No evidence of bowel obstruction or diffuse adynamic ileus. No free air. 2. Opacity at the right lung base, incompletely imaged. Consider follow-up PA and lateral chest radiographs for further assessment. Electronically Signed   By: Amie Portland M.D.   On: 11/06/2015 07:37    Procedures Procedures (including critical  care time)  Medications Ordered in ED Medications  dicyclomine (BENTYL) capsule 20 mg (20 mg Oral Given 11/06/15 0625)  ondansetron (ZOFRAN-ODT) disintegrating tablet 4 mg (4 mg Oral Given 11/06/15 0625)  acetaminophen (TYLENOL) tablet 1,000 mg (1,000 mg Oral Given 11/06/15 0800)     Initial Impression / Assessment and Plan / ED Course  I have reviewed the triage vital signs and the nursing notes.  Pertinent labs & imaging results that were available during my care of the patient were reviewed by me and considered in my medical decision making (see chart for details).  Clinical Course  Value Comment By Time  Lipase: (!) 127 Patient states that he has improved but having some slight abdominal pain. Reevaluation of the abdomen shows no focal abdominal pain. Patient states that he does drink etoh daily and drinks about 3-4 beers. He had one 24oz beer last night.  Arthor Captain, PA-C 10/01 1610   Patient KUB film reviewed.  No evidence of significant constipation or SBO. Abnormal finding in the R lung field. I  have informed the patient and will proceed with 2 view cxr Arthor Captain, PA-C 10/01 0805  EKG 12-Lead ECG interpretation   Date: @EDTODAY @  Rate: 61  Rhythm: normal sinus rhythm  QRS Axis: normal  Intervals: normal  ST/T Wave abnormalities: normal  Conduction Disutrbances: none  Narrative Interpretation:   Old EKG Reviewed:  none Arthor Captain, PA-C 10/01 0809   Patient cxr negative.  D/c with liquid diet, bentyl, and zofran. Avoid etoh. Patient is nontoxic, nonseptic appearing, in no apparent distress.  Patient's pain and other symptoms adequately managed in emergency department.  Fluid bolus given.  Labs, imaging and vitals reviewed.  Patient does not meet the SIRS or Sepsis criteria.  On repeat exam patient does not have a surgical abdomin and there are no peritoneal signs.  No indication of appendicitis, bowel obstruction, bowel perforation, cholecystitis, diverticulitis, PID  or ectopic pregnancy.  Patient discharged home with symptomatic treatment and given strict instructions for follow-up with their primary care physician.  I have also discussed reasons to return immediately to the ER.  Patient expresses understanding and agrees with plan.  Arthor Captain, PA-C 10/01 1610      Final Clinical Impressions(s) / ED Diagnoses   Final diagnoses:  Generalized abdominal pain  Elevated lipase    New Prescriptions Discharge Medication List as of 11/06/2015  9:23 AM    START taking these medications   Details  dicyclomine (BENTYL) 20 MG tablet Take 1 tablet (20 mg total) by mouth 4 (four) times daily as needed for spasms., Starting Sun 11/06/2015, Print    ondansetron (ZOFRAN) 4 MG tablet Take 1 tablet (4 mg total) by mouth every 8 (eight) hours as needed for nausea or vomiting., Starting Sun 11/06/2015, Print         Arthor Captain, PA-C 11/06/15 1100    Rolland Porter, MD 11/10/15 [phone]

## 2015-11-06 NOTE — ED Triage Notes (Signed)
Pt reports abdominal pain for three days, last bowel movement also three days ago, vomited once yesterday, no fever.

## 2015-11-06 NOTE — ED Notes (Signed)
Pt in radiology 

## 2015-11-09 ENCOUNTER — Emergency Department (HOSPITAL_COMMUNITY)
Admission: EM | Admit: 2015-11-09 | Discharge: 2015-11-09 | Disposition: A | Discharge status: Home / Self Care | Payer: Self-pay | Attending: Emergency Medicine | Admitting: Emergency Medicine

## 2015-11-09 ENCOUNTER — Emergency Department (HOSPITAL_COMMUNITY): Payer: Self-pay

## 2015-11-09 ENCOUNTER — Encounter (HOSPITAL_COMMUNITY): Payer: Self-pay

## 2015-11-09 DIAGNOSIS — K5900 Constipation, unspecified: Secondary | ICD-10-CM | POA: Insufficient documentation

## 2015-11-09 DIAGNOSIS — R1084 Generalized abdominal pain: Secondary | ICD-10-CM

## 2015-11-09 DIAGNOSIS — I10 Essential (primary) hypertension: Secondary | ICD-10-CM | POA: Insufficient documentation

## 2015-11-09 DIAGNOSIS — F17210 Nicotine dependence, cigarettes, uncomplicated: Secondary | ICD-10-CM | POA: Insufficient documentation

## 2015-11-09 LAB — URINALYSIS, ROUTINE W REFLEX MICROSCOPIC
BILIRUBIN URINE: NEGATIVE
Glucose, UA: NEGATIVE mg/dL
Hgb urine dipstick: NEGATIVE
KETONES UR: NEGATIVE mg/dL
Leukocytes, UA: NEGATIVE
NITRITE: NEGATIVE
Protein, ur: NEGATIVE mg/dL
Specific Gravity, Urine: 1.02 (ref 1.005–1.030)
pH: 6.5 (ref 5.0–8.0)

## 2015-11-09 LAB — CBC WITH DIFFERENTIAL/PLATELET
BASOS PCT: 0 %
Basophils Absolute: 0 10*3/uL (ref 0.0–0.1)
EOS ABS: 0.2 10*3/uL (ref 0.0–0.7)
Eosinophils Relative: 3 %
HCT: 47.3 % (ref 39.0–52.0)
Hemoglobin: 16.1 g/dL (ref 13.0–17.0)
Lymphocytes Relative: 34 %
Lymphs Abs: 2.1 10*3/uL (ref 0.7–4.0)
MCH: 30 pg (ref 26.0–34.0)
MCHC: 34 g/dL (ref 30.0–36.0)
MCV: 88.2 fL (ref 78.0–100.0)
MONO ABS: 0.6 10*3/uL (ref 0.1–1.0)
MONOS PCT: 10 %
NEUTROS PCT: 53 %
Neutro Abs: 3.3 10*3/uL (ref 1.7–7.7)
Platelets: 235 10*3/uL (ref 150–400)
RBC: 5.36 MIL/uL (ref 4.22–5.81)
RDW: 12.9 % (ref 11.5–15.5)
WBC: 6.1 10*3/uL (ref 4.0–10.5)

## 2015-11-09 LAB — COMPREHENSIVE METABOLIC PANEL
ALBUMIN: 3.9 g/dL (ref 3.5–5.0)
ALK PHOS: 62 U/L (ref 38–126)
ALT: 19 U/L (ref 17–63)
ANION GAP: 6 (ref 5–15)
AST: 23 U/L (ref 15–41)
BILIRUBIN TOTAL: 0.4 mg/dL (ref 0.3–1.2)
BUN: 9 mg/dL (ref 6–20)
CALCIUM: 9.3 mg/dL (ref 8.9–10.3)
CO2: 24 mmol/L (ref 22–32)
Chloride: 106 mmol/L (ref 101–111)
Creatinine, Ser: 0.95 mg/dL (ref 0.61–1.24)
GFR calc Af Amer: >60 mL/min (ref >60)
GLUCOSE: 115 mg/dL — AB (ref 65–99)
Potassium: 3.7 mmol/L (ref 3.5–5.1)
Sodium: 136 mmol/L (ref 135–145)
TOTAL PROTEIN: 6.7 g/dL (ref 6.5–8.1)

## 2015-11-09 LAB — POC OCCULT BLOOD, ED: FECAL OCCULT BLD: NEGATIVE

## 2015-11-09 LAB — LIPASE, BLOOD: Lipase: 34 U/L (ref 11–51)

## 2015-11-09 MED ORDER — FAMOTIDINE IN NACL 20-0.9 MG/50ML-% IV SOLN
20.0000 mg | Freq: Once | INTRAVENOUS | Status: AC
  Administered 2015-11-09: 20 mg via INTRAVENOUS
  Filled 2015-11-09: qty 50

## 2015-11-09 MED ORDER — OMEPRAZOLE 20 MG PO CPDR: 20.0000 mg | DELAYED_RELEASE_CAPSULE | Freq: Every day | ORAL | 0 refills | Status: DC

## 2015-11-09 MED ORDER — SUCRALFATE 1 G PO TABS
1.0000 g | ORAL_TABLET | Freq: Once | ORAL | Status: AC
  Administered 2015-11-09: 1 g via ORAL
  Filled 2015-11-09: qty 1

## 2015-11-09 MED ORDER — GI COCKTAIL ~~LOC~~
30.0000 mL | Freq: Once | ORAL | Status: AC
  Administered 2015-11-09: 30 mL via ORAL
  Filled 2015-11-09: qty 30

## 2015-11-09 MED ORDER — PEG 3350-KCL-NABCB-NACL-NASULF 236 G PO SOLR: 4000.0000 mL | Freq: Once | ORAL | 0 refills | Status: AC

## 2015-11-09 MED ORDER — IOPAMIDOL (ISOVUE-300) INJECTION 61%
INTRAVENOUS | Status: AC
  Administered 2015-11-09: 75 mL via INTRAVENOUS
  Filled 2015-11-09: qty 75

## 2015-11-09 MED ORDER — DICYCLOMINE HCL 10 MG PO CAPS
10.0000 mg | ORAL_CAPSULE | Freq: Once | ORAL | Status: AC
  Administered 2015-11-09: 10 mg via ORAL
  Filled 2015-11-09: qty 1

## 2015-11-09 NOTE — ED Triage Notes (Signed)
Pt c\o upper ABD pain for 4-5 days. Pt denies n/v/d. Last bowel movement was yesterday.

## 2015-11-09 NOTE — Care Management Note (Signed)
Case Management Note  Patient Details  Name: Carl Russell MRN: [id number] Date of Birth: [birth year]  Subjective/Objective:                  35 year old male with no significant past medical history or previous abdominal surgeries, presenting with generalized abdominal pain and constipation that has been worsening over the past 5-6 days.   Action/Plan: Follow for disposition needs. /Set up appointment for PCP establishment.   Expected Discharge Date:  11/09/15               Expected Discharge Plan:  Home/Self Care  In-House Referral:  NA  Discharge planning Services  CM Consult, Follow-up appt scheduled  Post Acute Care Choice:  NA Choice offered to:  Patient  DME Arranged:  N/A DME Agency:  NA  HH Arranged:  NA HH Agency:  NA  Status of Service:  In process, will continue to follow  If discussed at Long Length of Stay Meetings, dates discussed:    Additional Comments: Carl Russell J. Lucretia RoersWood, RN, BSN, [place] [phone] [facility].  Spoke with pt at bedside and provided brochure with directions and phone number highlighted.  Pt verbalizes understanding of keeping appointment.  Carl CohnWood, [name], RN 11/09/2015, 9:16 AM

## 2015-11-09 NOTE — ED Notes (Signed)
EKG given to Dr. Kohut 

## 2015-11-09 NOTE — ED Provider Notes (Signed)
[facility] Provider Note   CSN: [id number] Arrival date & time: 11/09/15  0709     History   Chief Complaint Chief Complaint  Patient presents with  . Abdominal Pain    HPI Carl Russell is a 35 y.o. male.  Patient is a 35 year old male with no significant past medical history or previous abdominal surgeries, presenting with generalized abdominal pain and constipation that has been worsening over the past 5-6 days. Describes the pain as bloated feeling with intermittent cramping in the lower quadrants. Also reports epigastric burning that started last evening, which he states "feels like heartburn." Denies any nausea, vomiting, diarrhea, blood in stools, dysuria, or hematuria. Has been eating and drinking with no change in appetite (last PO intake 8:00 last evening), with bowel movement at 2:00 this morning. Describes the stool as "small hard pieces," but well formed and without blood. States he has had dark stools, but taking Pepto for abdominal pain. Also tried OTC mineral oil without relief of constipation, and ibuprofen for pain. Was evaluated in ED on 10/1 for similar complaint, and sent home with prescription Bentyl. He did not fill the prescription. He denies any fever, chills, chest pain, palpitations, shortness of breath, or back pain.    History reviewed. No pertinent past medical history.  There are no active problems to display for this patient.   History reviewed. No pertinent surgical history.     Home Medications    Prior to Admission medications   Medication Sig Start Date End Date Taking? Authorizing Provider  dicyclomine (BENTYL) 20 MG tablet Take 1 tablet (20 mg total) by mouth 4 (four) times daily as needed for spasms. 11/06/15   Arthor Captain, PA-C  loperamide (IMODIUM) 2 MG capsule Take 1 capsule (2 mg total) by mouth 4 (four) times daily as needed for diarrhea or loose stools. 07/22/14   Benjiman Core, MD  ondansetron (ZOFRAN) 4 MG tablet Take 1  tablet (4 mg total) by mouth every 8 (eight) hours as needed for nausea or vomiting. 11/06/15   Arthor Captain, PA-C    Family History No family history on file.  Social History Social History  Substance Use Topics  . Smoking status: Current Every Day Smoker    Packs/day: 0.50    Types: Cigarettes  . Smokeless tobacco: Not on file  . Alcohol use Yes     Comment: occ     Allergies   Review of patient's allergies indicates no known allergies.   Review of Systems Review of Systems  Constitutional: Negative for chills and fever.  HENT: Negative for ear pain and sore throat.   Eyes: Negative for pain and visual disturbance.  Respiratory: Negative for cough and shortness of breath.   Cardiovascular: Negative for chest pain and palpitations.  Gastrointestinal: Positive for abdominal pain and constipation. Negative for blood in stool, nausea, rectal pain and vomiting.  Genitourinary: Negative for dysuria and hematuria.  Musculoskeletal: Negative for back pain and neck pain.  Skin: Negative for color change and rash.  Neurological: Negative for dizziness, seizures, syncope and headaches.  All other systems reviewed and are negative.    Physical Exam Updated Vital Signs BP (!) 186/111 (BP Location: Left Arm)   Pulse (!) 57   Temp 98 F (36.7 C) (Oral)   Resp 18   Ht 5\' 8"  (1.727 m)   Wt 104.3 kg   SpO2 99%   BMI 34.97 kg/m   Physical Exam  Constitutional: He appears well-developed and well-nourished. No distress.  HENT:  Head: Normocephalic and atraumatic.  Mouth/Throat: Oropharynx is clear and moist.  Eyes: Conjunctivae are normal.  Neck: Normal range of motion. Neck supple.  Cardiovascular: Normal rate, regular rhythm, normal heart sounds and intact distal pulses.   Pulmonary/Chest: Effort normal and breath sounds normal. No respiratory distress.  Abdominal: Soft.  Neg Murphy's, neg McBurney's, no CVA tenderness. Hyperactive bowel sounds in lower abdominal  quadrants. Mild TTP LLQ. No guarding or peritoneal signs   Musculoskeletal: Normal range of motion. He exhibits no edema or tenderness.  Neurological: He is alert.  Skin: Skin is warm and dry.  Psychiatric: He has a normal mood and affect.  Nursing note and vitals reviewed.    ED Treatments / Results  Labs (all labs ordered are listed, but only abnormal results are displayed) Labs Reviewed - No data to display  EKG  EKG Interpretation  Date/Time:  Wednesday November 09 2015 07:15:14 EDT Ventricular Rate:  63 PR Interval:    QRS Duration: 80 QT Interval:  451 QTC Calculation: 462 R Axis:   47 Text Interpretation:  Sinus rhythm No significant change since last tracing Confirmed by KOHUT  MD, STEPHEN ([id number]) on 11/09/2015 7:22:38 AM       Radiology No results found.  Procedures Procedures (including critical care time)  Medications Ordered in ED Medications - No data to display   Initial Impression / Assessment and Plan / ED Course  I have reviewed the triage vital signs and the nursing notes.  Pertinent labs & imaging results that were available during my care of the patient were reviewed by me and considered in my medical decision making (see chart for details).  Clinical Course   Patient is 35 yo M with no significant PMH or abdominal surgeries, presenting with generalized abdominal pain and constipation that has been worsening over the past 5-6 days. Was evaluated in ED on 10/1 for similar complaint, and sent home with prescription Bentyl, which he did not fill. States symptoms have been constant since d/c, but denies any nausea or vomiting. CBC, CMP, lipase, hemoccult, and UA ordered, all of which are unremarkable. CT abdomen ordered given return to ED with no improvement, which shows no acute abdominal pathology. EKG ordered given epigastric burning, but unremarkable, and likely symptoms of reflux. Bentyl, Pepcid, GI cocktail, and Carafate given for relief of pain.  Also noted to have elevated BP as noted below, but no primary care physician. Case Management consulted to help establish care with PCP. On reassessment, patient states he is feeling better. Abdomen is soft and non-tender, and BP improving. Stable for d/c home with Golytely for constipation and Prilosec for reflux. Also advised patient to fill prior prescription of Bentyl for relief of abdominal pain. [facility] [place] [facility] at 9:30 AM. Patient appreciative of care, and advised to return to ED for new or worsening symptoms.  Vitals:   11/09/15 0815 11/09/15 0830 11/09/15 0930 11/09/15 0945  BP: 152/93 (!) 151/106 142/87 137/94  Pulse: (!) 59 81 (!) 57 62  Resp: 14 22  18   Temp:      TempSrc:      SpO2: 100% 98% 99% 98%  Weight:      Height:         Final Clinical Impressions(s) / ED Diagnoses   Final diagnoses:  Constipation, unspecified constipation type  Generalized abdominal pain  Uncontrolled hypertension    New Prescriptions Discharge Medication  List as of 11/09/2015  9:48 AM    START taking these medications   Details  omeprazole (PRILOSEC) 20 MG capsule Take 1 capsule (20 mg total) by mouth daily., Starting Wed 11/09/2015, Print    polyethylene glycol (GOLYTELY) 236 g solution Take 4,000 mLs by mouth once., Starting Wed 11/09/2015, Print         Ivonne Andrew Trinity II, [place] 11/09/15 1127    Raeford Razor, MD 11/09/15 1257

## 2015-11-11 ENCOUNTER — Inpatient Hospital Stay: Payer: Self-pay

## 2015-11-16 ENCOUNTER — Emergency Department (HOSPITAL_COMMUNITY)
Admission: EM | Admit: 2015-11-16 | Discharge: 2015-11-16 | Disposition: A | Discharge status: Home / Self Care | Payer: Self-pay | Attending: Emergency Medicine | Admitting: Emergency Medicine

## 2015-11-16 DIAGNOSIS — L03012 Cellulitis of left finger: Secondary | ICD-10-CM | POA: Insufficient documentation

## 2015-11-16 DIAGNOSIS — F17210 Nicotine dependence, cigarettes, uncomplicated: Secondary | ICD-10-CM | POA: Insufficient documentation

## 2015-11-16 MED ORDER — CLINDAMYCIN HCL 300 MG PO CAPS: 300.0000 mg | ORAL_CAPSULE | Freq: Four times a day (QID) | ORAL | 0 refills | Status: DC

## 2015-11-16 NOTE — ED Provider Notes (Signed)
[facility] Provider Note   CSN: [id number] Arrival date & time: 11/16/15  0711     History   Chief Complaint Chief Complaint  Patient presents with  . Abscess    HPI Carl Russell is a 35 y.o. male.  Pt comes in with c/o pain and swelling to his left pinky finger. He states that he thinks that he was bit by something. Denies numbness or weakness. States that this started 2 days ago. No history of similar symptoms.      No past medical history on file.  There are no active problems to display for this patient.   No past surgical history on file.     Home Medications    Prior to Admission medications   Medication Sig Start Date End Date Taking? Authorizing Provider  acetaminophen (TYLENOL) 500 MG tablet Take 1,000 mg by mouth every 6 (six) hours as needed for moderate pain or headache.    Historical Provider, MD  dicyclomine (BENTYL) 20 MG tablet Take 1 tablet (20 mg total) by mouth 4 (four) times daily as needed for spasms. 11/06/15   Arthor CaptainAbigail Harris, PA-C  ibuprofen (ADVIL,MOTRIN) 200 MG tablet Take 800 mg by mouth every 6 (six) hours as needed for headache or moderate pain.    Historical Provider, MD  loperamide (IMODIUM) 2 MG capsule Take 1 capsule (2 mg total) by mouth 4 (four) times daily as needed for diarrhea or loose stools. Patient not taking: Reported on 11/09/2015 07/22/14   Benjiman CoreNathan Heron Pitcock, MD  omeprazole (PRILOSEC) 20 MG capsule Take 1 capsule (20 mg total) by mouth daily. 11/09/15   Daryl F de Villier II, PA  ondansetron (ZOFRAN) 4 MG tablet Take 1 tablet (4 mg total) by mouth every 8 (eight) hours as needed for nausea or vomiting. 11/06/15   Arthor CaptainAbigail Harris, PA-C    Family History No family history on file.  Social History Social History  Substance Use Topics  . Smoking status: Current Every Day Smoker    Packs/day: 0.50    Types: Cigarettes  . Smokeless tobacco: Not on file  . Alcohol use Yes     Comment: occ     Allergies   Review of  patient's allergies indicates no known allergies.   Review of Systems Review of Systems  All other systems reviewed and are negative.    Physical Exam Updated Vital Signs BP (!) 156/115 (BP Location: Right Arm)   Pulse 90   Temp 98.8 F (37.1 C) (Oral)   Resp 20   Ht 5\' 9"  (1.753 m)   Wt 105 kg   SpO2 98%   BMI 34.20 kg/m   Physical Exam  Constitutional: He appears well-developed and well-nourished.  Cardiovascular: Normal rate.   Pulmonary/Chest: Effort normal and breath sounds normal.  Musculoskeletal: Normal range of motion.  Neurological: He is alert.  Localized area of redness over the middle phalanx of the left pinky finger. No fluctuance noted. Area warm to the tough. No definite abscess noted  Skin: Capillary refill takes less than 2 seconds.     ED Treatments / Results  Labs (all labs ordered are listed, but only abnormal results are displayed) Labs Reviewed - No data to display  EKG  EKG Interpretation None       Radiology No results found.  Procedures Procedures (including critical care time)  Medications Ordered in ED Medications - No data to display   Initial Impression / Assessment and Plan / ED Course  I have reviewed  the triage vital signs and the nursing notes.  Pertinent labs & imaging results that were available during my care of the patient were reviewed by me and considered in my medical decision making (see chart for details).  Clinical Course    Exam consistent with cellulitis. No definite abscess noted. Will treat with antibiotics  Final Clinical Impressions(s) / ED Diagnoses   Final diagnoses:  None    New Prescriptions New Prescriptions   No medications on file     Teressa Lower, NP 11/16/15 1610    Shaune Pollack, MD 11/16/15 1756

## 2015-11-16 NOTE — ED Triage Notes (Signed)
Pt reports possible spider bite to left little finger, redness noted.

## 2016-01-21 ENCOUNTER — Encounter (HOSPITAL_COMMUNITY): Payer: Self-pay | Admitting: *Deleted

## 2016-01-21 ENCOUNTER — Emergency Department (HOSPITAL_COMMUNITY)
Admission: EM | Admit: 2016-01-21 | Discharge: 2016-01-21 | Disposition: A | Discharge status: Home / Self Care | Payer: Self-pay | Attending: Emergency Medicine | Admitting: Emergency Medicine

## 2016-01-21 DIAGNOSIS — Z5321 Procedure and treatment not carried out due to patient leaving prior to being seen by health care provider: Secondary | ICD-10-CM | POA: Insufficient documentation

## 2016-01-21 DIAGNOSIS — I10 Essential (primary) hypertension: Secondary | ICD-10-CM | POA: Insufficient documentation

## 2016-01-21 LAB — CBC
HCT: 46.7 % (ref 39.0–52.0)
Hemoglobin: 16.2 g/dL (ref 13.0–17.0)
MCH: 29.9 pg (ref 26.0–34.0)
MCHC: 34.7 g/dL (ref 30.0–36.0)
MCV: 86.3 fL (ref 78.0–100.0)
PLATELETS: 265 10*3/uL (ref 150–400)
RBC: 5.41 MIL/uL (ref 4.22–5.81)
RDW: 12.8 % (ref 11.5–15.5)
WBC: 5.8 10*3/uL (ref 4.0–10.5)

## 2016-01-21 LAB — COMPREHENSIVE METABOLIC PANEL
ALT: 29 U/L (ref 17–63)
AST: 28 U/L (ref 15–41)
Albumin: 4.1 g/dL (ref 3.5–5.0)
Alkaline Phosphatase: 79 U/L (ref 38–126)
Anion gap: 4 — ABNORMAL LOW (ref 5–15)
BUN: 7 mg/dL (ref 6–20)
CHLORIDE: 107 mmol/L (ref 101–111)
CO2: 25 mmol/L (ref 22–32)
CREATININE: 0.98 mg/dL (ref 0.61–1.24)
Calcium: 9.3 mg/dL (ref 8.9–10.3)
GFR calc non Af Amer: >60 mL/min (ref >60)
Glucose, Bld: 122 mg/dL — ABNORMAL HIGH (ref 65–99)
POTASSIUM: 3.7 mmol/L (ref 3.5–5.1)
SODIUM: 136 mmol/L (ref 135–145)
Total Bilirubin: 0.5 mg/dL (ref 0.3–1.2)
Total Protein: 7 g/dL (ref 6.5–8.1)

## 2016-01-21 NOTE — ED Notes (Signed)
Called no answer

## 2016-01-21 NOTE — ED Notes (Signed)
Called and no answer in waiting room 

## 2016-01-21 NOTE — ED Triage Notes (Signed)
The pt had a physical exam in [place] ansd was found to have high bp  The pt came here for treatemnt.  Hx of high bp no meds ever  Headache for 2 hours since [facility]

## 2016-01-21 NOTE — ED Notes (Signed)
Called pt in  Waiting room and unable to find.

## 2016-04-29 ENCOUNTER — Encounter (HOSPITAL_COMMUNITY): Payer: Self-pay | Admitting: Emergency Medicine

## 2016-04-29 ENCOUNTER — Emergency Department (HOSPITAL_COMMUNITY)
Admission: EM | Admit: 2016-04-29 | Discharge: 2016-04-29 | Disposition: A | Discharge status: Home / Self Care | Payer: Self-pay | Attending: Emergency Medicine | Admitting: Emergency Medicine

## 2016-04-29 DIAGNOSIS — Z79899 Other long term (current) drug therapy: Secondary | ICD-10-CM | POA: Insufficient documentation

## 2016-04-29 DIAGNOSIS — K219 Gastro-esophageal reflux disease without esophagitis: Secondary | ICD-10-CM | POA: Insufficient documentation

## 2016-04-29 DIAGNOSIS — R1013 Epigastric pain: Secondary | ICD-10-CM

## 2016-04-29 DIAGNOSIS — F17210 Nicotine dependence, cigarettes, uncomplicated: Secondary | ICD-10-CM | POA: Insufficient documentation

## 2016-04-29 LAB — CBC
HEMATOCRIT: 48.8 % (ref 39.0–52.0)
HEMOGLOBIN: 16.7 g/dL (ref 13.0–17.0)
MCH: 29.5 pg (ref 26.0–34.0)
MCHC: 34.2 g/dL (ref 30.0–36.0)
MCV: 86.1 fL (ref 78.0–100.0)
Platelets: 268 10*3/uL (ref 150–400)
RBC: 5.67 MIL/uL (ref 4.22–5.81)
RDW: 13.1 % (ref 11.5–15.5)
WBC: 7.6 10*3/uL (ref 4.0–10.5)

## 2016-04-29 LAB — COMPREHENSIVE METABOLIC PANEL
ALT: 22 U/L (ref 17–63)
ANION GAP: 12 (ref 5–15)
AST: 22 U/L (ref 15–41)
Albumin: 4.2 g/dL (ref 3.5–5.0)
Alkaline Phosphatase: 81 U/L (ref 38–126)
BILIRUBIN TOTAL: 0.3 mg/dL (ref 0.3–1.2)
BUN: 8 mg/dL (ref 6–20)
CO2: 21 mmol/L — ABNORMAL LOW (ref 22–32)
Calcium: 9.5 mg/dL (ref 8.9–10.3)
Chloride: 103 mmol/L (ref 101–111)
Creatinine, Ser: 0.94 mg/dL (ref 0.61–1.24)
GFR calc Af Amer: >60 mL/min (ref >60)
Glucose, Bld: 110 mg/dL — ABNORMAL HIGH (ref 65–99)
Potassium: 3.6 mmol/L (ref 3.5–5.1)
SODIUM: 136 mmol/L (ref 135–145)
TOTAL PROTEIN: 7 g/dL (ref 6.5–8.1)

## 2016-04-29 LAB — URINALYSIS, ROUTINE W REFLEX MICROSCOPIC
BILIRUBIN URINE: NEGATIVE
Glucose, UA: NEGATIVE mg/dL
HGB URINE DIPSTICK: NEGATIVE
Ketones, ur: NEGATIVE mg/dL
LEUKOCYTES UA: NEGATIVE
NITRITE: NEGATIVE
PROTEIN: NEGATIVE mg/dL
SPECIFIC GRAVITY, URINE: 1.008 (ref 1.005–1.030)
pH: 6 (ref 5.0–8.0)

## 2016-04-29 LAB — LIPASE, BLOOD: LIPASE: 31 U/L (ref 11–51)

## 2016-04-29 MED ORDER — GI COCKTAIL ~~LOC~~
30.0000 mL | Freq: Once | ORAL | Status: AC
  Administered 2016-04-29: 30 mL via ORAL
  Filled 2016-04-29: qty 30

## 2016-04-29 MED ORDER — OMEPRAZOLE 20 MG PO CPDR: 20.0000 mg | DELAYED_RELEASE_CAPSULE | Freq: Every day | ORAL | 0 refills | Status: DC

## 2016-04-29 NOTE — ED Triage Notes (Signed)
Pt c/o generalized abdominal pain ongoing for 2-3 weeks. Pt denies N/V.

## 2016-04-29 NOTE — ED Provider Notes (Signed)
[facility]-EMERGENCY DEPT Provider Note   CSN: [id number] Arrival date & time: 04/29/16  1734     History   Chief Complaint Chief Complaint  Patient presents with  . Abdominal Pain    HPI Carl Russell is a 36 y.o. male.  Carl Russell is a 36 y.o. Male who presents to the emergency department complaining of abdominal pain for the past 2-3 weeks. Patient reports pain worse in his epigastric area but also notes some right-sided upper abdominal pain. He reports symptoms of acid reflux with associated burping and belching. He reports today he was having abdominal pain and took a Zantac with relief of his symptoms. He denies previous abdominal surgeries. He does drink alcohol daily. He denies history of pancreatitis. He denies nausea, vomiting or diarrhea. He had a normal bowel movement today. He does report sometimes his pain is worse after eating. He does report some urinary frequency for about 3 weeks. No associated dysuria. He denies fevers, nausea, vomiting, diarrhea, dysuria, trouble urinating, chest pain, shortness of breath, coughing or rashes.   The history is provided by the patient, medical records and the spouse. No language interpreter was used.  Abdominal Pain   Associated symptoms include frequency. Pertinent negatives include fever, diarrhea, nausea, vomiting, dysuria and headaches.    History reviewed. No pertinent past medical history.  There are no active problems to display for this patient.   Past Surgical History:  Procedure Laterality Date  . FINGER SURGERY         Home Medications    Prior to Admission medications   Medication Sig Start Date End Date Taking? Authorizing Provider  ranitidine (ZANTAC) 75 MG tablet Take 75 mg by mouth daily as needed for heartburn.   Yes Historical Provider, MD  omeprazole (PRILOSEC) 20 MG capsule Take 1 capsule (20 mg total) by mouth daily. 04/29/16   Everlene Farrier, PA-C    Family History No family history on file.  Social  History Social History  Substance Use Topics  . Smoking status: Current Every Day Smoker    Packs/day: 0.50    Types: Cigarettes  . Smokeless tobacco: Never Used  . Alcohol use Yes     Comment: occ     Allergies   Patient has no known allergies.   Review of Systems Review of Systems  Constitutional: Negative for chills and fever.  HENT: Negative for congestion and sore throat.   Eyes: Negative for visual disturbance.  Respiratory: Negative for cough and shortness of breath.   Cardiovascular: Negative for chest pain.  Gastrointestinal: Positive for abdominal pain. Negative for blood in stool, diarrhea, nausea and vomiting.  Genitourinary: Positive for frequency. Negative for decreased urine volume, difficulty urinating, dysuria and urgency.  Musculoskeletal: Negative for back pain and neck pain.  Skin: Negative for rash.  Neurological: Negative for headaches.     Physical Exam Updated Vital Signs BP (!) 177/129 (BP Location: Left Arm)   Pulse 83   Temp 97.8 F (36.6 C) (Oral)   Resp 16   Ht 5\' 8"  (1.727 m)   Wt 104.3 kg   SpO2 99%   BMI 34.97 kg/m   Physical Exam  Constitutional: He appears well-developed and well-nourished. No distress.  Nontoxic appearing. Overweight male.  HENT:  Head: Normocephalic and atraumatic.  Mouth/Throat: Oropharynx is clear and moist.  Eyes: Conjunctivae are normal. Pupils are equal, round, and reactive to light. Right eye exhibits no discharge. Left eye exhibits no discharge.  Neck: Neck supple.  Cardiovascular: Normal  rate, regular rhythm, normal heart sounds and intact distal pulses.  Exam reveals no gallop and no friction rub.   No murmur heard. Pulmonary/Chest: Effort normal and breath sounds normal. No respiratory distress. He has no wheezes. He has no rales.  Abdominal: Soft. Bowel sounds are normal. He exhibits no distension and no mass. There is tenderness. There is no rebound and no guarding.  Abdomen is soft. Bowel sounds  are present. Patient has mild epigastric abdominal tenderness to palpation. No peritoneal signs. No Murphy's sign. No CVA or flank tenderness.  Musculoskeletal: He exhibits no edema.  Lymphadenopathy:    He has no cervical adenopathy.  Neurological: He is alert. Coordination normal.  Skin: Skin is warm and dry. No rash noted. He is not diaphoretic. No erythema. No pallor.  Psychiatric: He has a normal mood and affect. His behavior is normal.  Nursing note and vitals reviewed.    ED Treatments / Results  Labs (all labs ordered are listed, but only abnormal results are displayed) Labs Reviewed  COMPREHENSIVE METABOLIC PANEL - Abnormal; Notable for the following:       Result Value   CO2 21 (*)    Glucose, Bld 110 (*)    All other components within normal limits  URINALYSIS, ROUTINE W REFLEX MICROSCOPIC - Abnormal; Notable for the following:    Color, Urine STRAW (*)    All other components within normal limits  LIPASE, BLOOD  CBC    EKG  EKG Interpretation None       Radiology No results found.  Procedures Procedures (including critical care time)  Medications Ordered in ED Medications  gi cocktail (Maalox,Lidocaine,Donnatal) (not administered)     Initial Impression / Assessment and Plan / ED Course  I have reviewed the triage vital signs and the nursing notes.  Pertinent labs & imaging results that were available during my care of the patient were reviewed by me and considered in my medical decision making (see chart for details).     This is a 36 y.o. Male who presents to the emergency department complaining of abdominal pain for the past 2-3 weeks. Patient reports pain worse in his epigastric area but also notes some right-sided upper abdominal pain. He reports symptoms of acid reflux with associated burping and belching. He reports today he was having abdominal pain and took a Zantac with relief of his symptoms. He denies previous abdominal surgeries. He does  drink alcohol daily. He denies history of pancreatitis. He denies nausea, vomiting or diarrhea. He had a normal bowel movement today. He does report sometimes his pain is worse after eating. On exam the patient is afebrile nontoxic appearing. His abdomen is soft and his mild epigastric abdominal tenderness to palpation. No Murphy's sign. No right upper quadrant tenderness. No peritoneal signs. Urinalysis is without signs of infection. Lipase is within normal limits. CMP is unremarkable. Normal liver enzymes. CBC is within normal limits. Patient is tolerating by mouth. He is pain free at reevaluation. He has had no nausea or vomiting. He reports epigastric pain with eating. Blood work is unremarkable. I see no need for ultrasound at this time. He reports relief of his symptoms of Zantac. I suspect acid reflux versus a peptic ulcer. Will start him on omeprazole and discussed diet instructions. I discussed strict and specific return precautions with the patient. I advised the patient to follow-up with their primary care provider this week. I advised the patient to return to the emergency department with new or worsening  symptoms or new concerns. The patient verbalized understanding and agreement with plan.      Final Clinical Impressions(s) / ED Diagnoses   Final diagnoses:  Epigastric pain  Gastroesophageal reflux disease, esophagitis presence not specified    New Prescriptions New Prescriptions   OMEPRAZOLE (PRILOSEC) 20 MG CAPSULE    Take 1 capsule (20 mg total) by mouth daily.     Everlene FarrierWilliam Reygan Heagle, PA-C 04/29/16 2009    Donnetta HutchingBrian Cook, MD 05/01/16 1400

## 2016-04-29 NOTE — ED Notes (Signed)
Pt states he is having normal bowel movements and is having the pain on his right lower quadrant. Pt denies change in appetite.

## 2016-06-21 ENCOUNTER — Emergency Department (HOSPITAL_COMMUNITY)
Admission: EM | Admit: 2016-06-21 | Discharge: 2016-06-21 | Disposition: A | Discharge status: Home / Self Care | Payer: Self-pay

## 2017-04-25 IMAGING — CT CT ABD-PELV W/ CM
2 of 6 series · 17 of 46 positions shown, 19 images · IV contrast (APPLIED)
Comparison: 11/06/2015 plain radiographs

CLINICAL DATA: Persistent acute epigastric pain with constipation

EXAM:
CT ABDOMEN AND PELVIS WITH CONTRAST
TECHNIQUE: Multidetector CT imaging of the abdomen and pelvis was performed
using the standard protocol following bolus administration of
intravenous contrast.
CONTRAST:  100 cc [id number]

[Series 2: abd/ pelvis 5.0 i30f 1 · axial · 0.95mm/px · z∈[+773,+1208]mm · 14 of 99 slices shown, 16 images]
[im 6/99  soft-tissue]
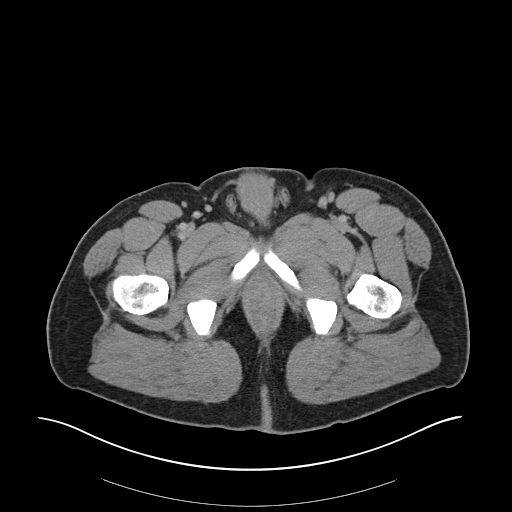
[im 6/99  bone]
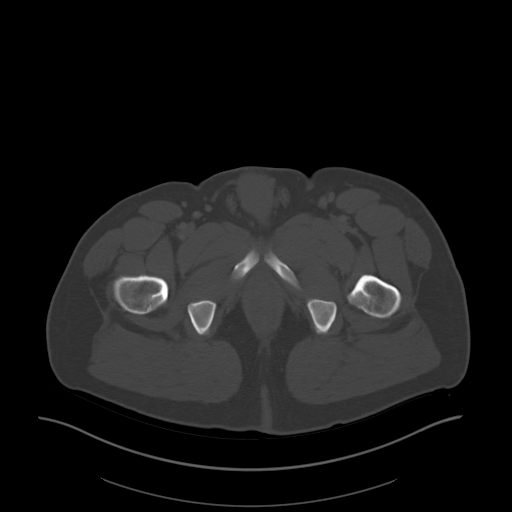
[im 12/99  soft-tissue]
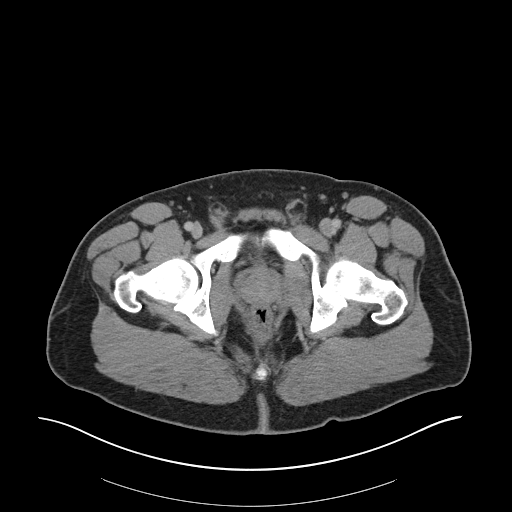
[im 18/99  soft-tissue]
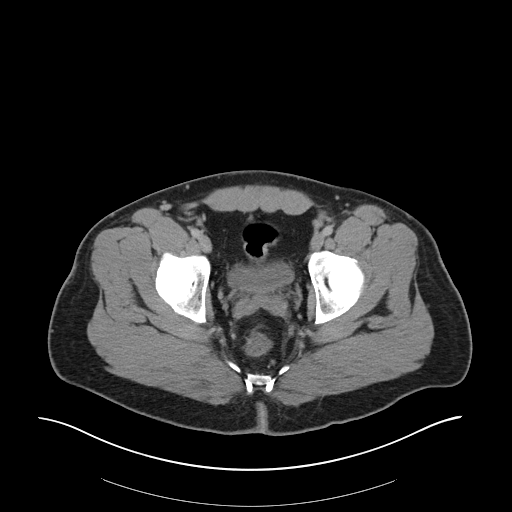
[im 29/99  soft-tissue]
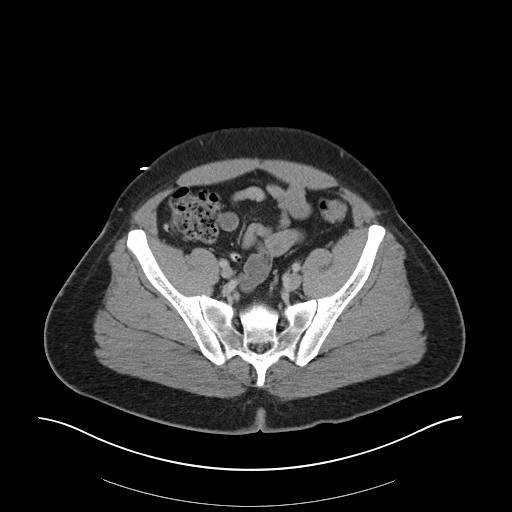
[im 35/99  soft-tissue]
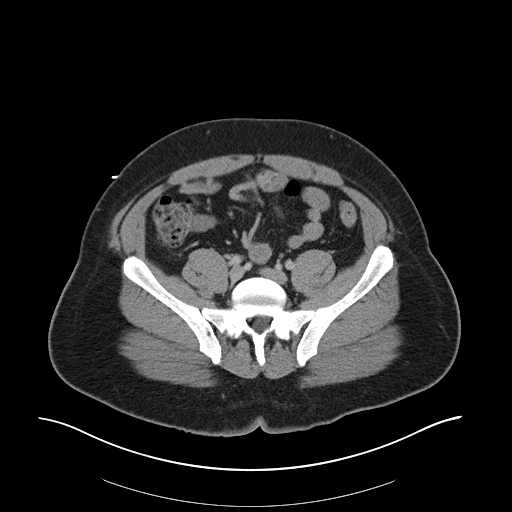
[im 41/99  soft-tissue]
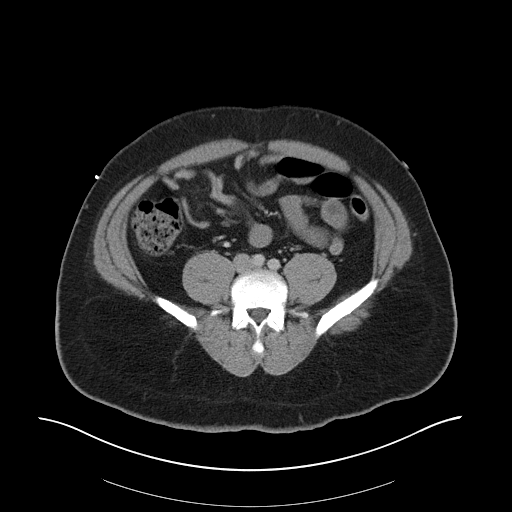
[im 47/99  soft-tissue]
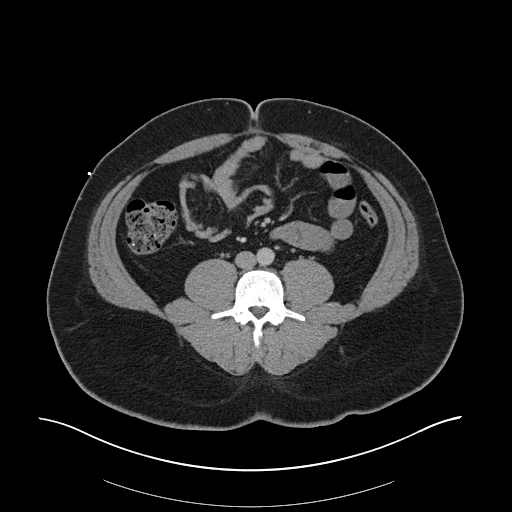
[im 52/99  soft-tissue]
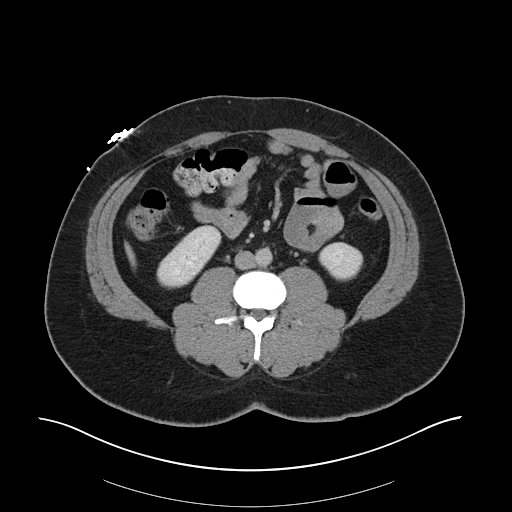
[im 58/99  soft-tissue]
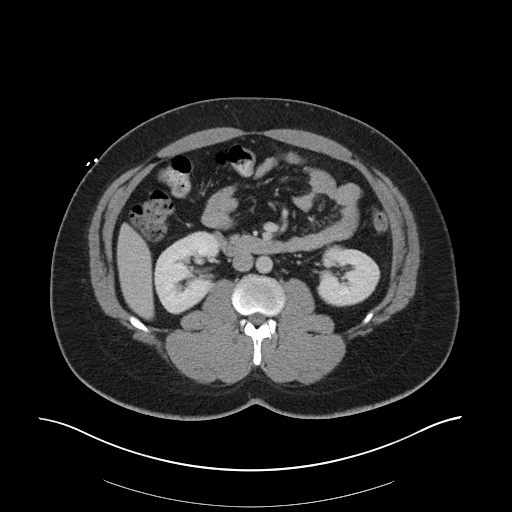
[im 58/99  bone]
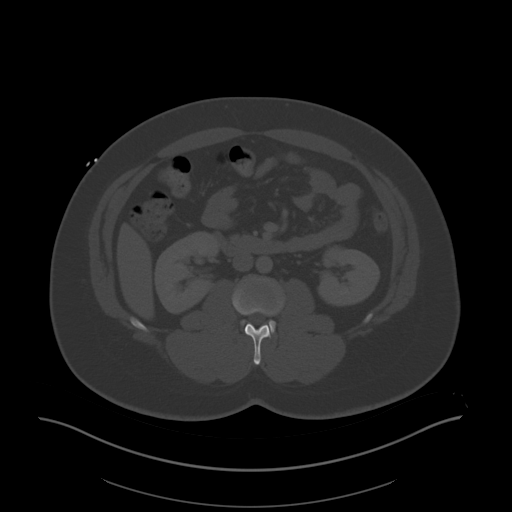
[im 64/99  soft-tissue]
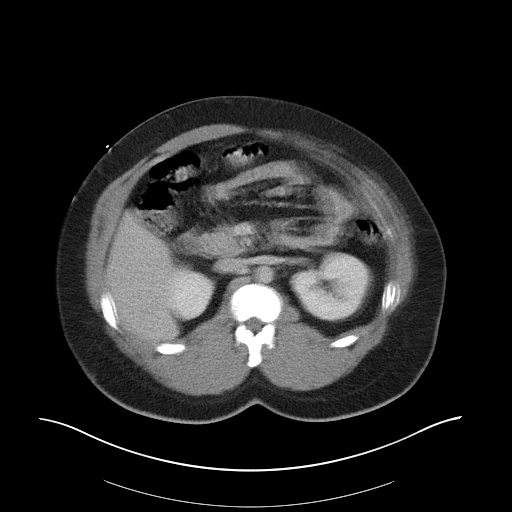
[im 75/99  soft-tissue]
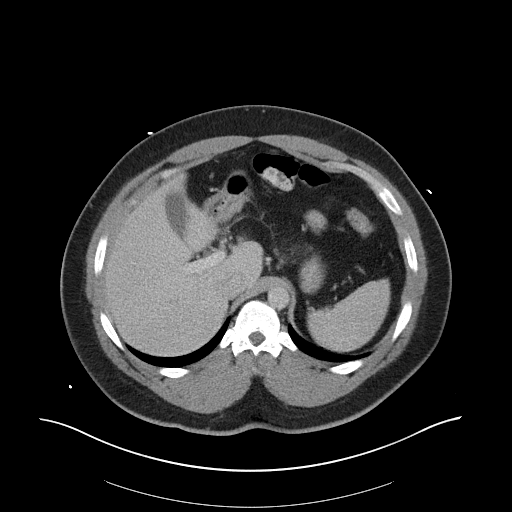
[im 81/99  soft-tissue]
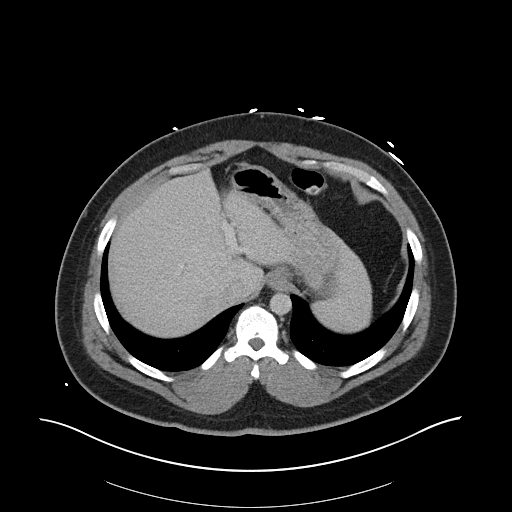
[im 87/99  soft-tissue]
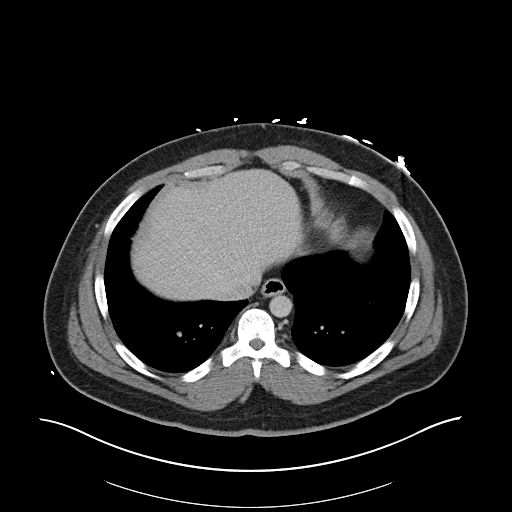
[im 93/99  soft-tissue]
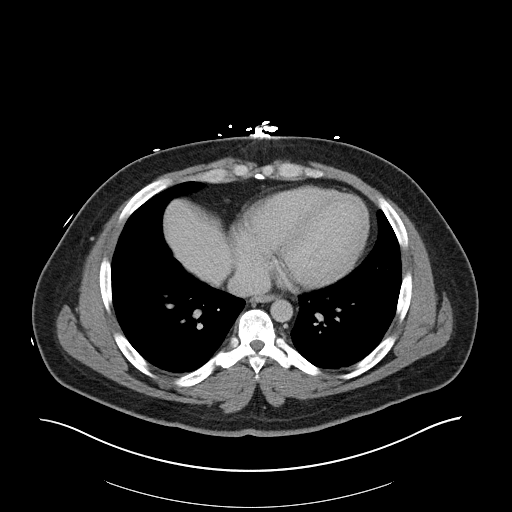

[Series 5: coronal soft tissue · coronal · 0.95mm/px · 3 of 101 slices shown]
[im 34/101  soft-tissue]
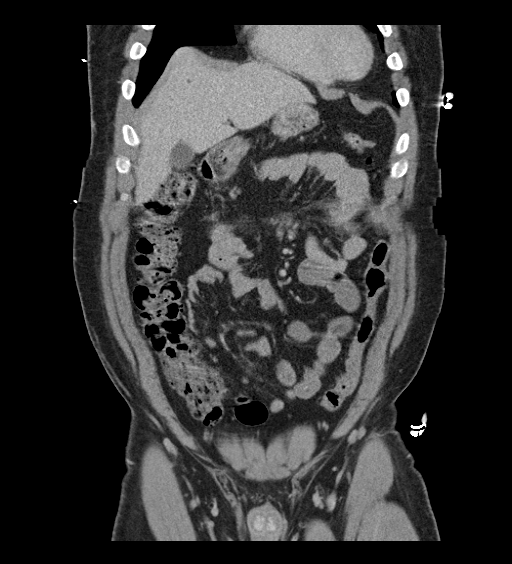
[im 45/101  soft-tissue]
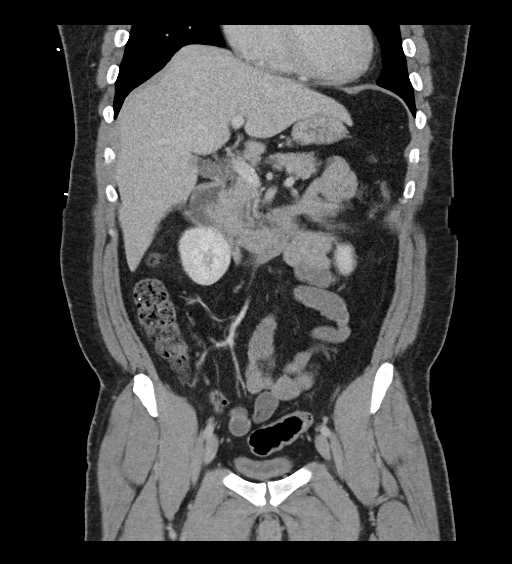
[im 56/101  soft-tissue]
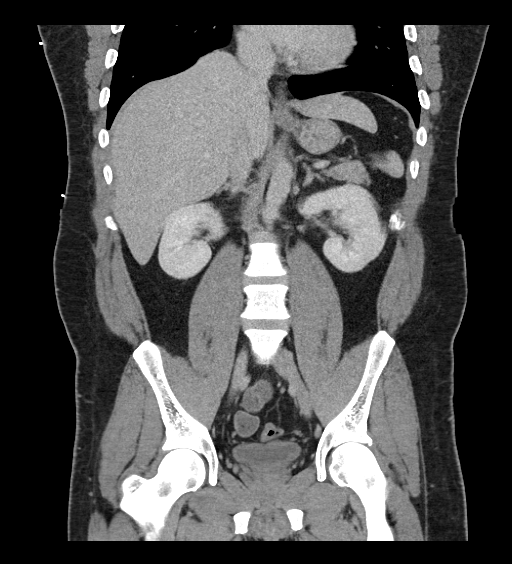

[17 of 46 positions shown; findings below may reference images not displayed]

FINDINGS: Study is mildly degraded with respiratory motion artifact despite
repeating some of the images.

Lower chest: No acute abnormality.

Hepatobiliary: No focal liver abnormality is seen. No gallstones,
gallbladder wall thickening, or biliary dilatation.

Pancreas: Unremarkable. No pancreatic ductal dilatation or
surrounding inflammatory changes.

Spleen: Normal in size without focal abnormality.

Adrenals/Urinary Tract: Adrenal glands are unremarkable. Kidneys are
normal, without renal calculi, focal lesion, or hydronephrosis.
Bladder is unremarkable.

Stomach/Bowel: Negative for bowel obstruction, significant
dilatation, ileus, or free air. Appendix appears normal but does
contain a radiodense appendicolith distally. No acute inflammatory
process. No fluid collection or abscess.

Vascular/Lymphatic: No significant vascular findings are present. No
enlarged abdominal or pelvic lymph nodes.

Reproductive: Prostate is unremarkable.

Other: No abdominal wall hernia or abnormality. No abdominopelvic
ascites.

Musculoskeletal: No acute or significant osseous findings.
IMPRESSION: No acute intra-abdominal or pelvic finding.

## 2019-03-04 ENCOUNTER — Other Ambulatory Visit: Payer: Self-pay

## 2019-03-04 ENCOUNTER — Emergency Department (HOSPITAL_COMMUNITY): Payer: Self-pay

## 2019-03-04 ENCOUNTER — Encounter (HOSPITAL_COMMUNITY): Payer: Self-pay | Admitting: Emergency Medicine

## 2019-03-04 ENCOUNTER — Emergency Department (HOSPITAL_COMMUNITY)
Admission: EM | Admit: 2019-03-04 | Discharge: 2019-03-04 | Disposition: A | Discharge status: Home / Self Care | Payer: Self-pay | Attending: Emergency Medicine | Admitting: Emergency Medicine

## 2019-03-04 DIAGNOSIS — K219 Gastro-esophageal reflux disease without esophagitis: Secondary | ICD-10-CM

## 2019-03-04 DIAGNOSIS — K5900 Constipation, unspecified: Secondary | ICD-10-CM | POA: Insufficient documentation

## 2019-03-04 DIAGNOSIS — R1011 Right upper quadrant pain: Secondary | ICD-10-CM | POA: Insufficient documentation

## 2019-03-04 DIAGNOSIS — F17210 Nicotine dependence, cigarettes, uncomplicated: Secondary | ICD-10-CM | POA: Insufficient documentation

## 2019-03-04 LAB — COMPREHENSIVE METABOLIC PANEL
ALT: 22 U/L (ref 0–44)
AST: 22 U/L (ref 15–41)
Albumin: 3.9 g/dL (ref 3.5–5.0)
Alkaline Phosphatase: 97 U/L (ref 38–126)
Anion gap: 10 (ref 5–15)
BUN: 9 mg/dL (ref 6–20)
CO2: 21 mmol/L — ABNORMAL LOW (ref 22–32)
Calcium: 9.4 mg/dL (ref 8.9–10.3)
Chloride: 106 mmol/L (ref 98–111)
Creatinine, Ser: 0.93 mg/dL (ref 0.61–1.24)
GFR calc Af Amer: >60 mL/min (ref >60)
GFR calc non Af Amer: >60 mL/min (ref >60)
Glucose, Bld: 147 mg/dL — ABNORMAL HIGH (ref 70–99)
Potassium: 3.6 mmol/L (ref 3.5–5.1)
Sodium: 137 mmol/L (ref 135–145)
Total Bilirubin: 0.8 mg/dL (ref 0.3–1.2)
Total Protein: 7.2 g/dL (ref 6.5–8.1)

## 2019-03-04 LAB — CBC
HCT: 50.6 % (ref 39.0–52.0)
Hemoglobin: 16.9 g/dL (ref 13.0–17.0)
MCH: 29.1 pg (ref 26.0–34.0)
MCHC: 33.4 g/dL (ref 30.0–36.0)
MCV: 87.2 fL (ref 80.0–100.0)
Platelets: 281 10*3/uL (ref 150–400)
RBC: 5.8 MIL/uL (ref 4.22–5.81)
RDW: 13 % (ref 11.5–15.5)
WBC: 5.8 10*3/uL (ref 4.0–10.5)
nRBC: 0 % (ref 0.0–0.2)

## 2019-03-04 LAB — URINALYSIS, ROUTINE W REFLEX MICROSCOPIC
Bilirubin Urine: NEGATIVE
Glucose, UA: NEGATIVE mg/dL
Hgb urine dipstick: NEGATIVE
Ketones, ur: NEGATIVE mg/dL
Leukocytes,Ua: NEGATIVE
Nitrite: NEGATIVE
Protein, ur: NEGATIVE mg/dL
Specific Gravity, Urine: 1.026 (ref 1.005–1.030)
pH: 5 (ref 5.0–8.0)

## 2019-03-04 LAB — LIPASE, BLOOD: Lipase: 65 U/L — ABNORMAL HIGH (ref 11–51)

## 2019-03-04 MED ORDER — SODIUM CHLORIDE 0.9% FLUSH
3.0000 mL | Freq: Once | INTRAVENOUS | Status: AC
  Administered 2019-03-04: 3 mL via INTRAVENOUS

## 2019-03-04 MED ORDER — BISACODYL 5 MG PO TBEC: 5.0000 mg | DELAYED_RELEASE_TABLET | Freq: Two times a day (BID) | ORAL | 0 refills | Status: DC

## 2019-03-04 MED ORDER — OMEPRAZOLE 20 MG PO CPDR: 20.0000 mg | DELAYED_RELEASE_CAPSULE | Freq: Every day | ORAL | 1 refills | Status: DC

## 2019-03-04 MED ORDER — SODIUM CHLORIDE 0.9 % IV BOLUS: 500.0000 mL | Freq: Once | INTRAVENOUS | Status: DC

## 2019-03-04 MED ORDER — ALUM & MAG HYDROXIDE-SIMETH 200-200-20 MG/5ML PO SUSP
30.0000 mL | Freq: Once | ORAL | Status: AC
  Administered 2019-03-04: 30 mL via ORAL
  Filled 2019-03-04: qty 30

## 2019-03-04 NOTE — Discharge Instructions (Signed)
Please take the omeprazole daily for reflux prevention.  I recommend that you buy Maalox over-the-counter at a pharmacy for abortive relief of your episodes of GERD, as needed.  I have also prescribed you Dulcolax which is a stool softener given your reported hard stools.  Please increase your oral hydration.  While I have lower suspicion for pancreatitis at this time, I encourage you to review the pancreatitis eating plan.  Please follow-up with gastroenterology as soon as possible for ongoing evaluation management of your epigastric discomfort.  Please return to the ED or seek immediate medical attention she develop any fevers or chills, worsening abdominal pain, or any other new or worsening symptoms.

## 2019-03-04 NOTE — ED Notes (Signed)
Pt went to [facility]  

## 2019-03-04 NOTE — ED Triage Notes (Signed)
Pt complains of epigastric pain with constipation for a few days. He reports stools are abnormal and have mucous in them without blood. Denies n/v. He is taking pepto-bismal. He also asks for some blood pressure medication at triage. He has not been formally diagnosed but states "I know I have it."

## 2019-03-04 NOTE — ED Provider Notes (Signed)
[facility] EMERGENCY DEPARTMENT Provider Note   CSN: [id number] Arrival date & time: 03/04/19  1321     History Chief Complaint  Patient presents with  . Abdominal Pain  . Constipation    Carl Russell is a 39 y.o. male with no significant PMH presents to the ED with a 2-week history of episodic nonradiating epigastric discomfort.  Patient reports that his discomfort has been improved/resolved with Pepto-Bismol.  He notices that it is worse with eating and with lying down.  His symptoms improve slightly when lying on his left side.  He denies any fevers or chills, recent illness, chest pain or difficulty breathing, headache, nausea or vomiting, or urinary symptoms.  He also endorses mild lightheadedness today as well as a couple weeks of mild constipation.  He denies any hematochezia, melena, or hematemesis.  HPI     History reviewed. No pertinent past medical history.  There are no problems to display for this patient.   Past Surgical History:  Procedure Laterality Date  . FINGER SURGERY         No family history on file.  Social History   Tobacco Use  . Smoking status: Current Every Day Smoker    Packs/day: 0.50    Types: Cigarettes  . Smokeless tobacco: Never Used  Substance Use Topics  . Alcohol use: Yes    Comment: occ  . Drug use: No    Home Medications Prior to Admission medications   Medication Sig Start Date End Date Taking? Authorizing Provider  bisacodyl (DULCOLAX) 5 MG EC tablet Take 1 tablet (5 mg total) by mouth 2 (two) times daily. 03/04/19   Corena Herter, PA-C  omeprazole (PRILOSEC) 20 MG capsule Take 1 capsule (20 mg total) by mouth daily. 03/04/19   Corena Herter, PA-C  ranitidine (ZANTAC) 75 MG tablet Take 75 mg by mouth daily as needed for heartburn.    [provider]    Allergies    Patient has no known allergies.  Review of Systems   Review of Systems  All other systems reviewed and are  negative.   Physical Exam Updated Vital Signs BP (!) 158/93   Pulse (!) 58   Temp 97.8 F (36.6 C) (Oral)   Resp 20   SpO2 100%   Physical Exam Vitals and nursing note reviewed. Exam conducted with a chaperone present.  Constitutional:      Appearance: Normal appearance. He is not ill-appearing.  HENT:     Head: Normocephalic and atraumatic.  Eyes:     General: No scleral icterus.    Conjunctiva/sclera: Conjunctivae normal.  Cardiovascular:     Rate and Rhythm: Normal rate and regular rhythm.     Pulses: Normal pulses.     Heart sounds: Normal heart sounds.  Pulmonary:     Effort: Pulmonary effort is normal. No respiratory distress.     Breath sounds: Normal breath sounds.  Abdominal:     Comments: Soft, nondistended.  TTP in epigastrium.  No TTP elsewhere.  Negative Murphy's or McBurney's point tenderness.  Bowel sounds normoactive throughout.  No guarding.  No overlying skin changes.  No masses appreciated.  Musculoskeletal:     Cervical back: Normal range of motion and neck supple.  Skin:    General: Skin is dry.     Capillary Refill: Capillary refill takes less than 2 seconds.  Neurological:     Mental Status: He is alert and oriented to person, place, and time.  GCS: GCS eye subscore is 4. GCS verbal subscore is 5. GCS motor subscore is 6.  Psychiatric:        Mood and Affect: Mood normal.        Behavior: Behavior normal.        Thought Content: Thought content normal.     ED Results / Procedures / Treatments   Labs (all labs ordered are listed, but only abnormal results are displayed) Labs Reviewed  LIPASE, BLOOD - Abnormal; Notable for the following components:      Result Value   Lipase 65 (*)    All other components within normal limits  COMPREHENSIVE METABOLIC PANEL - Abnormal; Notable for the following components:   CO2 21 (*)    Glucose, Bld 147 (*)    All other components within normal limits  CBC  URINALYSIS, ROUTINE W REFLEX MICROSCOPIC     EKG EKG Interpretation  Date/Time:  Wednesday March 04 2019 14:05:28 EST Ventricular Rate:  73 PR Interval:    QRS Duration: 79 QT Interval:  413 QTC Calculation: 456 R Axis:   79 Text Interpretation: Sinus rhythm Probable left atrial enlargement Borderline T abnormalities, inferior leads Baseline wander in lead(s) II III aVR aVL aVF No significant change since last tracing Confirmed by Linwood Dibbles [phone]) on 03/04/2019 2:40:34 PM   Radiology US Abdomen Limited  Result Date: 03/04/2019 CLINICAL DATA:  Right upper quadrant and epigastric pain for 10 days. Mildly elevated lipase level. EXAM: ULTRASOUND ABDOMEN LIMITED RIGHT UPPER QUADRANT COMPARISON:  CT abdomen from 11/09/2015 FINDINGS: Gallbladder: Contracted gallbladder, wall thickness of 0.3 cm is felt to be due to contraction. Sonographic Murphy's sign absent. No visualized renal calculi. Common bile duct: Diameter: 0.3 cm, within normal limits. Liver: No focal lesion identified. Within normal limits in parenchymal echogenicity. Please note that the left hepatic lobe was not well seen due to overlying bowel gas. Portal vein is patent on color Doppler imaging with normal direction of blood flow towards the liver. Other: None. IMPRESSION: 1. Contracted gallbladder, probably from recent meal. Normal appearance of the liver and CBD. 2. Reduced exam sensitivity in assessing the left hepatic lobe due to overlying bowel gas. Electronically Signed   By: Gaylyn Rong M.D.   On: 03/04/2019 16:15    Procedures Procedures (including critical care time)  Medications Ordered in ED Medications  sodium chloride flush (NS) 0.9 % injection 3 mL (3 mLs Intravenous Given 03/04/19 1406)  alum & mag hydroxide-simeth (MAALOX/MYLANTA) 200-200-20 MG/5ML suspension 30 mL (30 mLs Oral Given 03/04/19 1508)    ED Course  I have reviewed the triage vital signs and the nursing notes.  Pertinent labs & imaging results that were available during my care  of the patient were reviewed by me and considered in my medical decision making (see chart for details).    MDM Rules/Calculators/A&P                      CBC and CMP reassuring, lipase mildly elevated at 65.  Given that it is not greater than 3 times upper limit of normal, do not have incredibly high suspicion for pancreatitis.  Will obtain ultrasound RUQ and epigastrium to assess for possible gallstone induced pancreatitis.  [place] resulted and was negative.  Patient is in no acute distress on my examination.  UA demonstrates no ketones and clinically does not appear dehydrated.  Pushing fluids PO which he is tolerating well.   Patient developed epigastric abdominal discomfort while here  in the ED and was provided Maalox, which relieved his symptoms.  High suspicion for GERD as cause of his discomfort, particularly based on his history of discomfort worse with eating as well as with lying down flat and relieved with lying on left side.  Patient denies any melena, hematochezia, or hematemesis and I have low suspicion for PUD or H. pylori infection at this time.  Do not feel as though he needs to be emergently scoped, however believe he would possibly benefit from gastroenterology outpatient follow-up should his symptoms continue to persist.  We will also recommend that he get established with a primary care provider soon as possible for ongoing evaluation management of his suspected blood pressure.  During my exam, patient's blood pressure was 150s/90s and he is asymptomatic.  No lightheadedness on examination.  As for his constipation, will recommend over-the-counter stool softeners for symptomatic relief.   Final Clinical Impression(s) / ED Diagnoses Final diagnoses:  Gastroesophageal reflux disease, unspecified whether esophagitis present    Rx / DC Orders ED Discharge Orders         Ordered    omeprazole (PRILOSEC) 20 MG capsule  Daily     03/04/19 1650    bisacodyl (DULCOLAX) 5 MG EC tablet   2 times daily     03/04/19 1650           Elvera Maria 03/04/19 1650    Benjiman Core, MD 03/05/19 1014

## 2019-03-20 ENCOUNTER — Encounter: Payer: Self-pay | Admitting: Internal Medicine

## 2019-03-20 ENCOUNTER — Other Ambulatory Visit: Payer: Self-pay

## 2019-03-20 ENCOUNTER — Ambulatory Visit: Payer: Self-pay | Admitting: Internal Medicine

## 2019-03-20 VITALS — BP 180/122 | HR 86 | Resp 12 | Ht 66.0 in | Wt 237.0 lb

## 2019-03-20 DIAGNOSIS — F439 Reaction to severe stress, unspecified: Secondary | ICD-10-CM

## 2019-03-20 DIAGNOSIS — Z716 Tobacco abuse counseling: Secondary | ICD-10-CM

## 2019-03-20 DIAGNOSIS — Z720 Tobacco use: Secondary | ICD-10-CM

## 2019-03-20 DIAGNOSIS — R739 Hyperglycemia, unspecified: Secondary | ICD-10-CM

## 2019-03-20 DIAGNOSIS — I10 Essential (primary) hypertension: Secondary | ICD-10-CM

## 2019-03-20 MED ORDER — LISINOPRIL-HYDROCHLOROTHIAZIDE 10-12.5 MG PO TABS: 1.0000 | ORAL_TABLET | Freq: Every day | ORAL | 11 refills | Status: DC

## 2019-03-20 NOTE — Progress Notes (Signed)
Subjective:    Patient ID: Carl Russell, male   DOB: [birth year], 39 y.o.   MRN: [id number]   HPI   Here to establish  1.  Elevated BP:  Has noted his bp has been elevated for 2 years. His father has hypertension.  A sister also has noted elevated BPs.   No LE edema, Dyspnea or chest pain.  2.  GERD:  Recent ED visit end of January for epigastric discomfort relieved with Maalox.  Did have mild elevation of Lipase, normal abdominal U/S and admits to hight percentage alcohol content wine--a bottle per night for years.    3.  Tobacco abuse:  Has quit cold [place] previously.  Not sure he is ready at this point.  No outpatient medications have been marked as taking for the 03/20/19 encounter (Office Visit) with Julieanne Manson, MD.   No Known Allergies   No past medical history on file.  Past Surgical History:  Procedure Laterality Date  . FINGER SURGERY     Social History   Socioeconomic History  . Marital status: Single    Spouse name: Not on file  . Number of children: Not on file  . Years of education: Not on file  . Highest education level: Not on file  Occupational History  . Not on file  Tobacco Use  . Smoking status: Current Every Day Smoker    Packs/day: 1.00    Years: 25.00    Pack years: 25.00    Types: Cigarettes  . Smokeless tobacco: Never Used  Substance and Sexual Activity  . Alcohol use: Yes    Comment: Daily wine--high percentage--whole bottle daily.  . Drug use: Not Currently    Types: Marijuana  . Sexual activity: Yes  Other Topics Concern  . Not on file  Social History Narrative  . Not on file   Social Determinants of Health   Financial Resource Strain:   . Difficulty of Paying Living Expenses: Not on file  Food Insecurity: No Food Insecurity  . Worried About Programme researcher, broadcasting/film/video in the Last Year: Never true  . Ran Out of Food in the Last Year: Never true  Transportation Needs: No Transportation Needs  . Lack of Transportation  (Medical): No  . Lack of Transportation (Non-Medical): No  Physical Activity:   . Days of Exercise per Week: Not on file  . Minutes of Exercise per Session: Not on file  Stress:   . Feeling of Stress : Not on file  Social Connections:   . Frequency of Communication with Friends and Family: Not on file  . Frequency of Social Gatherings with Friends and Family: Not on file  . Attends Religious Services: Not on file  . Active Member of Clubs or Organizations: Not on file  . Attends Banker Meetings: Not on file  . Marital Status: Not on file  Intimate Partner Violence: Not At Risk  . Fear of Current or Ex-Partner: No  . Emotionally Abused: No  . Physically Abused: No  . Sexually Abused: No         Review of Systems    Objective:   BP (!) 180/122 (BP Location: Left Arm, Patient Position: Sitting, Cuff Size: Large)   Pulse 86   Resp 12   Ht 5\' 6"  (1.676 m)   Wt 237 lb (107.5 kg)   BMI 38.25 kg/m   Physical Exam  NAD, obese HEENT:  PERRL, EOMI Neck:  Supple, No adenopathy, no thyromegaly Chest:  CTA CV:  RRR with normal S1 and S2, No S3, S4 or murmur.  No carotid bruits.  Carotid, radial and DP pulses normal and equal Abd:  S, NT, No HSM or mass, + BS LE:  No edema.    Assessment & Plan   1.  Essential Hypertension:  Discussed getting back to a better diet and daily regular physical activity.  Also to stop drinking, which he feels he can do. Lisinopril 10/HCTZ 12.5 mg daily in morning. F/U 1 week for bp and pulse check with fasting labs:  FLP, A1C, BMP.  2.  Hyperglycemia and obesity:  Lifestyle changes recommended as above.  Labs in 1 week  3.  Tobacco Abuse:  Discussed options.  He would like to address alcohol first.  Discussed if he has future use of MJ, to consider edible instead.  4.  Alcohol abuse:  Discussed his mildly elevated lipase and the possibility of element of pancreatitis for abdominal pain recently.   He would like counseling with  LCSW when she gets started with [place].

## 2019-03-22 ENCOUNTER — Encounter: Payer: Self-pay | Admitting: Internal Medicine

## 2019-03-22 ENCOUNTER — Telehealth: Payer: Self-pay | Admitting: Internal Medicine

## 2019-03-22 DIAGNOSIS — K047 Periapical abscess without sinus: Secondary | ICD-10-CM

## 2019-03-22 MED ORDER — PENICILLIN V POTASSIUM 500 MG PO TABS: ORAL_TABLET | ORAL | 0 refills | Status: AC

## 2019-03-22 NOTE — Telephone Encounter (Signed)
Patient called this morning with [facility]. In toilet/outside of stool and on tissue.   Denies pain. Has been taking Goody powders for tooth abscess No melena or hematochezia. Denies constipation or diarrhea or obvious straining.  Discussed likely not due to bp med--but goody powders and other NSAIDS can make the bleeding worse.  Try tylenol for pain and will send in Rx for pcn--not allergice to antibiotics. Discussed needs orange card so can ultimately refer to dental clinic.

## 2019-03-27 ENCOUNTER — Other Ambulatory Visit: Payer: Self-pay

## 2019-04-16 ENCOUNTER — Telehealth: Payer: Self-pay | Admitting: Clinical

## 2019-04-16 NOTE — Telephone Encounter (Signed)
Social worker attempted to contact patient mobile number to see if still interested in counseling. Social worker unable to leave VM as it was full.

## 2019-06-17 ENCOUNTER — Ambulatory Visit: Payer: Self-pay | Admitting: Internal Medicine

## 2019-07-08 ENCOUNTER — Emergency Department (HOSPITAL_COMMUNITY)
Admission: EM | Admit: 2019-07-08 | Discharge: 2019-07-08 | Disposition: A | Discharge status: Home / Self Care | Payer: Self-pay | Attending: Emergency Medicine | Admitting: Emergency Medicine

## 2019-07-08 ENCOUNTER — Encounter (HOSPITAL_COMMUNITY): Payer: Self-pay | Admitting: Emergency Medicine

## 2019-07-08 DIAGNOSIS — I10 Essential (primary) hypertension: Secondary | ICD-10-CM | POA: Insufficient documentation

## 2019-07-08 DIAGNOSIS — Z79899 Other long term (current) drug therapy: Secondary | ICD-10-CM | POA: Insufficient documentation

## 2019-07-08 DIAGNOSIS — F17210 Nicotine dependence, cigarettes, uncomplicated: Secondary | ICD-10-CM | POA: Insufficient documentation

## 2019-07-08 DIAGNOSIS — K2920 Alcoholic gastritis without bleeding: Secondary | ICD-10-CM | POA: Insufficient documentation

## 2019-07-08 DIAGNOSIS — R197 Diarrhea, unspecified: Secondary | ICD-10-CM | POA: Insufficient documentation

## 2019-07-08 LAB — CBC WITH DIFFERENTIAL/PLATELET
Abs Immature Granulocytes: 0.02 10*3/uL (ref 0.00–0.07)
Basophils Absolute: 0 10*3/uL (ref 0.0–0.1)
Basophils Relative: 1 %
Eosinophils Absolute: 0.1 10*3/uL (ref 0.0–0.5)
Eosinophils Relative: 2 %
HCT: 46.9 % (ref 39.0–52.0)
Hemoglobin: 15.7 g/dL (ref 13.0–17.0)
Immature Granulocytes: 0 %
Lymphocytes Relative: 31 %
Lymphs Abs: 1.7 10*3/uL (ref 0.7–4.0)
MCH: 30.1 pg (ref 26.0–34.0)
MCHC: 33.5 g/dL (ref 30.0–36.0)
MCV: 89.8 fL (ref 80.0–100.0)
Monocytes Absolute: 0.6 10*3/uL (ref 0.1–1.0)
Monocytes Relative: 11 %
Neutro Abs: 3 10*3/uL (ref 1.7–7.7)
Neutrophils Relative %: 55 %
Platelets: 292 10*3/uL (ref 150–400)
RBC: 5.22 MIL/uL (ref 4.22–5.81)
RDW: 12.1 % (ref 11.5–15.5)
WBC: 5.5 10*3/uL (ref 4.0–10.5)
nRBC: 0 % (ref 0.0–0.2)

## 2019-07-08 LAB — COMPREHENSIVE METABOLIC PANEL
ALT: 28 U/L (ref 0–44)
AST: 26 U/L (ref 15–41)
Albumin: 4.1 g/dL (ref 3.5–5.0)
Alkaline Phosphatase: 82 U/L (ref 38–126)
Anion gap: 11 (ref 5–15)
BUN: 7 mg/dL (ref 6–20)
CO2: 21 mmol/L — ABNORMAL LOW (ref 22–32)
Calcium: 9.1 mg/dL (ref 8.9–10.3)
Chloride: 106 mmol/L (ref 98–111)
Creatinine, Ser: 1.04 mg/dL (ref 0.61–1.24)
GFR calc Af Amer: >60 mL/min (ref >60)
GFR calc non Af Amer: >60 mL/min (ref >60)
Glucose, Bld: 114 mg/dL — ABNORMAL HIGH (ref 70–99)
Potassium: 3.7 mmol/L (ref 3.5–5.1)
Sodium: 138 mmol/L (ref 135–145)
Total Bilirubin: 0.9 mg/dL (ref 0.3–1.2)
Total Protein: 6.8 g/dL (ref 6.5–8.1)

## 2019-07-08 LAB — LIPASE, BLOOD: Lipase: 32 U/L (ref 11–51)

## 2019-07-08 MED ORDER — FAMOTIDINE 20 MG PO TABS
20.0000 mg | ORAL_TABLET | Freq: Two times a day (BID) | ORAL | 0 refills | Status: AC
Start: 2019-07-08 — End: ?

## 2019-07-08 MED ORDER — LIDOCAINE VISCOUS HCL 2 % MT SOLN
15.0000 mL | Freq: Once | OROMUCOSAL | Status: AC
  Administered 2019-07-08: 15 mL via ORAL
  Filled 2019-07-08: qty 15

## 2019-07-08 MED ORDER — PANTOPRAZOLE SODIUM 40 MG PO TBEC
40.0000 mg | DELAYED_RELEASE_TABLET | Freq: Every day | ORAL | 0 refills | Status: AC
Start: 2019-07-08 — End: ?

## 2019-07-08 MED ORDER — ALUM & MAG HYDROXIDE-SIMETH 200-200-20 MG/5ML PO SUSP
30.0000 mL | Freq: Once | ORAL | Status: AC
  Administered 2019-07-08: 30 mL via ORAL
  Filled 2019-07-08: qty 30

## 2019-07-08 NOTE — ED Provider Notes (Signed)
[facility] EMERGENCY DEPARTMENT Provider Note   CSN: [id number] Arrival date & time: 07/08/19  0935     History Chief Complaint  Patient presents with  . Abdominal Pain    Carl Russell is a 39 y.o. male.  HPI 39 year old male presents with epigastric abdominal pain.  Has been on and off for about a month.  Occurs nearly every day.  It is worse when it wakes him up at around 4 AM and then when he originally wakes up in the morning.  Then seems to come and go throughout the day.  It is burning and sharp.  Occasionally will radiate up into his chest.  However no chest pain today and no shortness of breath.  No vomiting or fevers.  He has been having some diarrhea without blood but states he thinks this is from taking Mylanta.  Whenever he does take over-the-counter medicine such as Mylanta or Rolaids it does seem to help but then it comes back.  States he formally drank about 120 ounces of beer per day though over the last couple weeks he has been cutting back and last drank 2 days ago.  History reviewed. No pertinent past medical history.  Patient Active Problem List   Diagnosis Date Noted  . Essential hypertension 03/20/2019  . Hyperglycemia 03/20/2019  . Tobacco abuse 03/20/2019    Past Surgical History:  Procedure Laterality Date  . FINGER SURGERY         No family history on file.  Social History   Tobacco Use  . Smoking status: Current Every Day Smoker    Packs/day: 1.00    Years: 25.00    Pack years: 25.00    Types: Cigarettes  . Smokeless tobacco: Never Used  Substance Use Topics  . Alcohol use: Yes    Comment: Daily wine--high percentage--whole bottle daily.  . Drug use: Not Currently    Types: Marijuana    Home Medications Prior to Admission medications   Medication Sig Start Date End Date Taking? Authorizing Provider  alum & mag hydroxide-simeth (MAALOX/MYLANTA) 200-200-20 MG/5ML suspension Take 15 mLs by mouth daily.   Yes [provider]  lisinopril-hydrochlorothiazide (ZESTORETIC) 10-12.5 MG tablet Take 1 tablet by mouth daily. 03/20/19  Yes Julieanne Manson, MD  famotidine (PEPCID) 20 MG tablet Take 1 tablet (20 mg total) by mouth 2 (two) times daily. 07/08/19   Pricilla Loveless, MD  pantoprazole (PROTONIX) 40 MG tablet Take 1 tablet (40 mg total) by mouth daily. 07/08/19   Pricilla Loveless, MD  penicillin v potassium (VEETID) 500 MG tablet 1 tab by mouth 4 times daily for 7 days. Patient not taking: Reported on 07/08/2019 03/22/19   Julieanne Manson, MD    Allergies    Shellfish allergy  Review of Systems   Review of Systems  Constitutional: Negative for fever.  Respiratory: Negative for shortness of breath.   Gastrointestinal: Positive for abdominal pain and diarrhea. Negative for vomiting.  All other systems reviewed and are negative.   Physical Exam Updated Vital Signs BP (!) 171/116 (BP Location: Left Arm)   Pulse 92   Temp 98.4 F (36.9 C) (Oral)   Resp 14   Ht 5\' 8"  (1.727 m)   Wt 106.6 kg   SpO2 99%   BMI 35.73 kg/m   Physical Exam Vitals and nursing note reviewed.  Constitutional:      Appearance: He is well-developed.  HENT:     Head: Normocephalic and atraumatic.  Right Ear: External ear normal.     Left Ear: External ear normal.     Nose: Nose normal.  Eyes:     General:        Right eye: No discharge.        Left eye: No discharge.  Cardiovascular:     Rate and Rhythm: Normal rate and regular rhythm.     Heart sounds: Normal heart sounds.  Pulmonary:     Effort: Pulmonary effort is normal.     Breath sounds: Normal breath sounds.  Abdominal:     Palpations: Abdomen is soft.     Tenderness: There is abdominal tenderness (mild) in the epigastric area and left upper quadrant.  Musculoskeletal:     Cervical back: Neck supple.  Skin:    General: Skin is warm and dry.  Neurological:     Mental Status: He is alert.  Psychiatric:        Mood and Affect: Mood is not  anxious.     ED Results / Procedures / Treatments   Labs (all labs ordered are listed, but only abnormal results are displayed) Labs Reviewed  COMPREHENSIVE METABOLIC PANEL - Abnormal; Notable for the following components:      Result Value   CO2 21 (*)    Glucose, Bld 114 (*)    All other components within normal limits  LIPASE, BLOOD  CBC WITH DIFFERENTIAL/PLATELET    EKG EKG Interpretation  Date/Time:  Wednesday July 08 2019 11:58:47 EDT Ventricular Rate:  65 PR Interval:    QRS Duration: 80 QT Interval:  453 QTC Calculation: 471 R Axis:   51 Text Interpretation: Sinus rhythm no acute ST/T changes Confirmed by Sherwood Gambler [phone]) on 07/08/2019 12:07:59 PM   Radiology No results found.  Procedures Procedures (including critical care time)  Medications Ordered in ED Medications  alum & mag hydroxide-simeth (MAALOX/MYLANTA) 200-200-20 MG/5ML suspension 30 mL (30 mLs Oral Given 07/08/19 1052)    And  lidocaine (XYLOCAINE) 2 % viscous mouth solution 15 mL (15 mLs Oral Given 07/08/19 1052)    ED Course  I have reviewed the triage vital signs and the nursing notes.  Pertinent labs & imaging results that were available during my care of the patient were reviewed by me and considered in my medical decision making (see chart for details).    MDM Rules/Calculators/A&P                      Patient's abdominal pain sounds like gastritis.  Labs are reviewed and unremarkable.  ECG is benign.  Given GI cocktail with some relief.  Fortunately he has cut back on his alcohol abuse.  Will prescribe PPI and H2 blocker and referred to his PCP.  Return precautions. Final Clinical Impression(s) / ED Diagnoses Final diagnoses:  Acute alcoholic gastritis without hemorrhage    Rx / DC Orders ED Discharge Orders         Ordered    pantoprazole (PROTONIX) 40 MG tablet  Daily     07/08/19 1229    famotidine (PEPCID) 20 MG tablet  2 times daily     07/08/19 1229             Sherwood Gambler, MD 07/08/19 1232

## 2019-07-08 NOTE — ED Notes (Signed)
Patient verbalizes understanding of discharge instructions. Opportunity for questioning and answers were provided. Armband removed by staff, pt discharged from ED.  

## 2019-07-08 NOTE — ED Triage Notes (Signed)
Pt arrives to ED with epigastric pain history of pancreatis- pt does drink alcohol but states " I have been cutting back this week". Pt states it normally drinks everyday "120 oz" of beer a day. Last drink Sunday.

## 2019-07-08 NOTE — Discharge Instructions (Signed)
If you develop worsening, continued, or recurrent abdominal pain, uncontrolled vomiting, fever, chest or back pain, or any other new/concerning symptoms then return to the ER for evaluation.  

## 2019-07-15 ENCOUNTER — Telehealth: Payer: Self-pay | Admitting: Internal Medicine

## 2019-07-15 NOTE — Telephone Encounter (Signed)
Patient called stating was seen at emergency room on 07/08/2019 due to abdominal pain. Patient states and was prescribed with famotidine (PEPCID) 20 MG tablet  and instructed to contact PCP to get more Rx's on this medication.  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  Please advise

## 2019-07-16 NOTE — Telephone Encounter (Signed)
Spoke with patient. Informed he would need to see Dr. Delrae Alfred but also medication is OTC . Patient verbalized understanding

## 2021-12-06 ENCOUNTER — Other Ambulatory Visit: Payer: Self-pay

## 2021-12-06 MED ORDER — LISINOPRIL-HYDROCHLOROTHIAZIDE 10-12.5 MG PO TABS: 1.0000 | ORAL_TABLET | Freq: Every day | ORAL | 1 refills | Status: AC

## 2022-02-02 ENCOUNTER — Ambulatory Visit: Payer: Self-pay | Admitting: Internal Medicine

## 2023-06-24 ENCOUNTER — Ambulatory Visit (HOSPITAL_BASED_OUTPATIENT_CLINIC_OR_DEPARTMENT_OTHER): Payer: Self-pay | Admitting: Family Medicine

## 2023-06-27 ENCOUNTER — Ambulatory Visit (HOSPITAL_BASED_OUTPATIENT_CLINIC_OR_DEPARTMENT_OTHER): Payer: Self-pay | Admitting: Family Medicine
